# Patient Record
Sex: Male | Born: 1965 | Race: White | Hispanic: No | Marital: Married | Smoking: Never smoker
Health system: Southern US, Community
[De-identification: ages and names within clinical notes are randomized; demographics above are authoritative.]

## PROBLEM LIST (undated history)

## (undated) DIAGNOSIS — R809 Proteinuria, unspecified: Secondary | ICD-10-CM

## (undated) DIAGNOSIS — I10 Essential (primary) hypertension: Secondary | ICD-10-CM

## (undated) DIAGNOSIS — E119 Type 2 diabetes mellitus without complications: Secondary | ICD-10-CM

## (undated) DIAGNOSIS — F419 Anxiety disorder, unspecified: Secondary | ICD-10-CM

## (undated) DIAGNOSIS — G930 Cerebral cysts: Secondary | ICD-10-CM

## (undated) DIAGNOSIS — G43909 Migraine, unspecified, not intractable, without status migrainosus: Secondary | ICD-10-CM

## (undated) DIAGNOSIS — E7800 Pure hypercholesterolemia, unspecified: Secondary | ICD-10-CM

## (undated) DIAGNOSIS — N529 Male erectile dysfunction, unspecified: Secondary | ICD-10-CM

## (undated) HISTORY — DX: Male erectile dysfunction, unspecified: N52.9

## (undated) HISTORY — DX: Migraine, unspecified, not intractable, without status migrainosus: G43.909

## (undated) HISTORY — DX: Proteinuria, unspecified: R80.9

## (undated) HISTORY — DX: Cerebral cysts: G93.0

## (undated) HISTORY — PX: OTHER SURGICAL HISTORY: SHX169

## (undated) HISTORY — PX: FUNCTIONAL ENDOSCOPIC SINUS SURGERY: SUR616

## (undated) HISTORY — DX: Anxiety disorder, unspecified: F41.9

---

## 2005-10-04 ENCOUNTER — Emergency Department: Payer: Self-pay | Admitting: Emergency Medicine

## 2006-09-06 ENCOUNTER — Ambulatory Visit: Payer: Self-pay | Admitting: Internal Medicine

## 2006-09-08 ENCOUNTER — Inpatient Hospital Stay: Payer: Self-pay | Admitting: Specialist

## 2006-09-08 ENCOUNTER — Other Ambulatory Visit: Payer: Self-pay

## 2006-09-11 ENCOUNTER — Other Ambulatory Visit: Payer: Self-pay

## 2006-09-11 ENCOUNTER — Emergency Department: Payer: Self-pay | Admitting: Unknown Physician Specialty

## 2006-09-14 ENCOUNTER — Emergency Department: Payer: Self-pay | Admitting: Emergency Medicine

## 2006-09-14 ENCOUNTER — Emergency Department (HOSPITAL_COMMUNITY): Admission: EM | Admit: 2006-09-14 | Discharge: 2006-09-15 | Payer: Self-pay | Admitting: Emergency Medicine

## 2006-10-06 ENCOUNTER — Other Ambulatory Visit: Payer: Self-pay

## 2006-10-06 ENCOUNTER — Emergency Department: Payer: Self-pay | Admitting: Emergency Medicine

## 2008-08-30 ENCOUNTER — Emergency Department (HOSPITAL_COMMUNITY): Admission: EM | Admit: 2008-08-30 | Discharge: 2008-08-30 | Payer: Self-pay | Admitting: Emergency Medicine

## 2008-09-07 ENCOUNTER — Ambulatory Visit: Payer: Self-pay | Admitting: Otolaryngology

## 2009-01-18 ENCOUNTER — Emergency Department: Payer: Self-pay | Admitting: Emergency Medicine

## 2009-04-29 ENCOUNTER — Emergency Department: Payer: Self-pay | Admitting: Emergency Medicine

## 2009-05-31 ENCOUNTER — Emergency Department (HOSPITAL_COMMUNITY): Admission: EM | Admit: 2009-05-31 | Discharge: 2009-05-31 | Payer: Self-pay | Admitting: Emergency Medicine

## 2009-06-17 ENCOUNTER — Emergency Department: Payer: Self-pay | Admitting: Internal Medicine

## 2009-10-10 ENCOUNTER — Emergency Department: Payer: Self-pay

## 2010-11-14 LAB — GLUCOSE, CAPILLARY: Glucose-Capillary: 249 mg/dL — ABNORMAL HIGH (ref 70–99)

## 2010-11-14 LAB — POCT I-STAT, CHEM 8
Hemoglobin: 17 g/dL (ref 13.0–17.0)
Sodium: 136 mEq/L (ref 135–145)
TCO2: 22 mmol/L (ref 0–100)

## 2011-06-04 ENCOUNTER — Emergency Department: Payer: Self-pay | Admitting: Emergency Medicine

## 2012-03-14 ENCOUNTER — Emergency Department: Payer: Self-pay | Admitting: Emergency Medicine

## 2012-07-14 ENCOUNTER — Emergency Department: Payer: Self-pay | Admitting: Emergency Medicine

## 2012-07-14 LAB — CK TOTAL AND CKMB (NOT AT ARMC)
CK, Total: 73 U/L (ref 35–232)
CK-MB: 2.2 ng/mL (ref 0.5–3.6)

## 2012-07-14 LAB — COMPREHENSIVE METABOLIC PANEL
Albumin: 4 g/dL (ref 3.4–5.0)
Alkaline Phosphatase: 83 U/L (ref 50–136)
Bilirubin,Total: 0.4 mg/dL (ref 0.2–1.0)
Glucose: 245 mg/dL — ABNORMAL HIGH (ref 65–99)
Osmolality: 279 (ref 275–301)
SGPT (ALT): 31 U/L (ref 12–78)

## 2012-07-14 LAB — PROTIME-INR: INR: 0.8

## 2012-07-14 LAB — CBC
MCH: 28.8 pg (ref 26.0–34.0)
MCHC: 34.5 g/dL (ref 32.0–36.0)
MCV: 84 fL (ref 80–100)
Platelet: 200 10*3/uL (ref 150–440)
RDW: 13.6 % (ref 11.5–14.5)

## 2012-07-14 LAB — APTT: Activated PTT: 27.4 secs (ref 23.6–35.9)

## 2012-07-14 LAB — TROPONIN I: Troponin-I: <0.02 ng/mL

## 2013-01-03 ENCOUNTER — Emergency Department: Payer: Self-pay | Admitting: Emergency Medicine

## 2013-01-03 LAB — BASIC METABOLIC PANEL
Calcium, Total: 8.9 mg/dL (ref 8.5–10.1)
Chloride: 103 mmol/L (ref 98–107)
Co2: 27 mmol/L (ref 21–32)
Creatinine: 1.03 mg/dL (ref 0.60–1.30)
Sodium: 136 mmol/L (ref 136–145)

## 2013-01-03 LAB — CK TOTAL AND CKMB (NOT AT ARMC): CK-MB: 1.7 ng/mL (ref 0.5–3.6)

## 2013-01-03 LAB — CBC
Platelet: 188 10*3/uL (ref 150–440)
RDW: 13.8 % (ref 11.5–14.5)
WBC: 6.3 10*3/uL (ref 3.8–10.6)

## 2013-01-03 LAB — TROPONIN I: Troponin-I: <0.02 ng/mL

## 2013-09-01 ENCOUNTER — Emergency Department: Payer: Self-pay | Admitting: Emergency Medicine

## 2013-11-09 ENCOUNTER — Emergency Department: Payer: Self-pay | Admitting: Emergency Medicine

## 2014-01-16 ENCOUNTER — Emergency Department: Payer: Self-pay | Admitting: Emergency Medicine

## 2014-01-16 LAB — CBC
HCT: 46 % (ref 40.0–52.0)
HGB: 15.4 g/dL (ref 13.0–18.0)
MCH: 28.6 pg (ref 26.0–34.0)
MCHC: 33.4 g/dL (ref 32.0–36.0)
MCV: 86 fL (ref 80–100)
PLATELETS: 215 10*3/uL (ref 150–440)
RBC: 5.38 10*6/uL (ref 4.40–5.90)
RDW: 13.7 % (ref 11.5–14.5)
WBC: 8.4 10*3/uL (ref 3.8–10.6)

## 2014-01-16 LAB — BASIC METABOLIC PANEL
ANION GAP: 5 — AB (ref 7–16)
BUN: 16 mg/dL (ref 7–18)
CALCIUM: 9 mg/dL (ref 8.5–10.1)
Chloride: 102 mmol/L (ref 98–107)
Co2: 28 mmol/L (ref 21–32)
Creatinine: 1.02 mg/dL (ref 0.60–1.30)
EGFR (Non-African Amer.): >60
GLUCOSE: 254 mg/dL — AB (ref 65–99)
Osmolality: 280 (ref 275–301)
POTASSIUM: 3.9 mmol/L (ref 3.5–5.1)
Sodium: 135 mmol/L — ABNORMAL LOW (ref 136–145)

## 2014-01-16 LAB — TROPONIN I: Troponin-I: <0.02 ng/mL

## 2014-10-04 ENCOUNTER — Emergency Department: Payer: Self-pay | Admitting: Emergency Medicine

## 2015-02-26 ENCOUNTER — Encounter: Payer: Self-pay | Admitting: Emergency Medicine

## 2015-02-26 ENCOUNTER — Emergency Department
Admission: EM | Admit: 2015-02-26 | Discharge: 2015-02-26 | Disposition: A | Discharge status: Home / Self Care | Payer: Medicare Other | Attending: Emergency Medicine | Admitting: Emergency Medicine

## 2015-02-26 DIAGNOSIS — B084 Enteroviral vesicular stomatitis with exanthem: Secondary | ICD-10-CM | POA: Diagnosis not present

## 2015-02-26 DIAGNOSIS — I10 Essential (primary) hypertension: Secondary | ICD-10-CM | POA: Insufficient documentation

## 2015-02-26 DIAGNOSIS — Z79899 Other long term (current) drug therapy: Secondary | ICD-10-CM | POA: Diagnosis not present

## 2015-02-26 DIAGNOSIS — Z880 Allergy status to penicillin: Secondary | ICD-10-CM | POA: Diagnosis not present

## 2015-02-26 DIAGNOSIS — E119 Type 2 diabetes mellitus without complications: Secondary | ICD-10-CM | POA: Diagnosis not present

## 2015-02-26 DIAGNOSIS — R21 Rash and other nonspecific skin eruption: Secondary | ICD-10-CM | POA: Diagnosis present

## 2015-02-26 HISTORY — DX: Essential (primary) hypertension: I10

## 2015-02-26 HISTORY — DX: Pure hypercholesterolemia, unspecified: E78.00

## 2015-02-26 HISTORY — DX: Type 2 diabetes mellitus without complications: E11.9

## 2015-02-26 MED ORDER — ACETAMINOPHEN-CODEINE #3 300-30 MG PO TABS
1.0000 | ORAL_TABLET | Freq: Four times a day (QID) | ORAL | Status: AC | PRN
Start: 2015-02-26 — End: ?

## 2015-02-26 MED ORDER — MAGIC MOUTHWASH W/LIDOCAINE: 10.0000 mL | Freq: Three times a day (TID) | ORAL | Status: AC | PRN

## 2015-02-26 MED ORDER — ACETAMINOPHEN-CODEINE #3 300-30 MG PO TABS
2.0000 | ORAL_TABLET | Freq: Once | ORAL | Status: AC
  Administered 2015-02-26: 2 via ORAL
  Filled 2015-02-26: qty 2

## 2015-02-26 MED ORDER — ONDANSETRON 4 MG PO TBDP
4.0000 mg | ORAL_TABLET | Freq: Once | ORAL | Status: AC
  Administered 2015-02-26: 4 mg via ORAL
  Filled 2015-02-26: qty 1

## 2015-02-26 NOTE — ED Notes (Signed)
Pt reports was hanging out with his granddaughter who had hand, foot and mouth and now he has blisters on his feet, hands and mouth. Pt wife reports she had it as well but she has started to clear and hers was not as bad.

## 2015-02-26 NOTE — Discharge Instructions (Signed)
Hand, Foot, and Mouth Disease Hand, foot, and mouth disease is a common viral illness. It occurs mainly in children younger than 51 years of age, but adolescents and adults may also get it. This disease is different than foot and mouth disease that cattle, sheep, and pigs get. Most people are better in 1 week. CAUSES  Hand, foot, and mouth disease is usually caused by a group of viruses called enteroviruses. Hand, foot, and mouth disease can spread from person to person (contagious). A person is most contagious during the first week of the illness. It is not transmitted to or from pets or other animals. It is most common in the summer and early fall. Infection is spread from person to person by direct contact with an infected person's:  Nose discharge.  Throat discharge.  Stool. SYMPTOMS  Open sores (ulcers) occur in the mouth. Symptoms may also include:  A rash on the hands and feet, and occasionally the buttocks.  Fever.  Aches.  Pain from the mouth ulcers.  Fussiness. DIAGNOSIS  Hand, foot, and mouth disease is one of many infections that cause mouth sores. To be certain your child has hand, foot, and mouth disease your caregiver will diagnose your child by physical exam.Additional tests are not usually needed. TREATMENT  Nearly all patients recover without medical treatment in 7 to 10 days. There are no common complications. Your child should only take over-the-counter or prescription medicines for pain, discomfort, or fever as directed by your caregiver. Your caregiver may recommend the use of an over-the-counter antacid or a combination of an antacid and diphenhydramine to help coat the lesions in the mouth and improve symptoms.  HOME CARE INSTRUCTIONS  Try combinations of foods to see what your child will tolerate and aim for a balanced diet. Soft foods may be easier to swallow. The mouth sores from hand, foot, and mouth disease typically hurt and are painful when exposed to  salty, spicy, or acidic food or drinks.  Milk and cold drinks are soothing for some patients. Milk shakes, frozen ice pops, slushies, and sherberts are usually well tolerated.  Sport drinks are good choices for hydration, and they also provide a few calories. Often, a child with hand, foot, and mouth disease will be able to drink without discomfort.   For younger children and infants, feeding with a cup, spoon, or syringe may be less painful than drinking through the nipple of a bottle.  Keep children out of childcare programs, schools, or other group settings during the first few days of the illness or until they are without fever. The sores on the body are not contagious. SEEK IMMEDIATE MEDICAL CARE IF:  Your child develops signs of dehydration such as:  Decreased urination.  Dry mouth, tongue, or lips.  Decreased tears or sunken eyes.  Dry skin.  Rapid breathing.  Fussy behavior.  Poor color or pale skin.  Fingertips taking longer than 2 seconds to turn pink after a gentle squeeze.  Rapid weight loss.  Your child does not have adequate pain relief.  Your child develops a severe headache, stiff neck, or change in behavior.  Your child develops ulcers or blisters that occur on the lips or outside of the mouth. Document Released: 04/15/2003 Document Revised: 10/09/2011 Document Reviewed: 12/29/2010 [facility] Patient Information 2015 [place], [place]. This information is not intended to replace advice given to you by your health care provider. Make sure you discuss any questions you have with your health care provider.  Take the  meds as directed.  Continue ibuprofen for fevers.  Increase fluid intake to reduce dehydration risk.  Follow-up with Dr. Burnett Sheng as needed.

## 2015-02-26 NOTE — ED Notes (Signed)
Pt says he thinks he has hand/foot mouth disease.  grandchhild and wife just had it.  He is concerned because his foot in inflamed andhe is diabetic.  Has rash all over.  Blisters on hands.  Throat sore.

## 2015-02-26 NOTE — ED Provider Notes (Signed)
[facility] Emergency Department Provider Note ____________________________________________  Time seen: 0930  I have reviewed the triage vital signs and the nursing notes.  HISTORY  Chief Complaint  Rash  HPI Joel Norman is a 49 y.o. male reports to the ED for evaluation management of presumed hand-foot-and-mouth disease. He has been exposed to the virus via his grandkids and his wife. He is here today noting intermittent fevers, blisters on his hands and feet, sore throat and loss of appetite. He is concerned over the red blisters on his feet, given his diabetic status. He denies drainage, weeping, oozing, crusting.   Past Medical History  Diagnosis Date  . Hypertension   . Diabetes mellitus without complication   . Elevated cholesterol    There are no active problems to display for this patient.  Past Surgical History  Procedure Laterality Date  . 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 atenolol (TENORMIN) 25 MG tablet   Oral   Take by mouth daily.         . furosemide (LASIX) 40 MG tablet   Oral   Take 40 mg by mouth.         Marland Kitchen glipiZIDE (GLUCOTROL) 10 MG tablet   Oral   Take 10 mg by mouth daily before breakfast.         . lisinopril (PRINIVIL,ZESTRIL) 10 MG tablet   Oral   Take 10 mg by mouth 2 (two) times daily.         . metFORMIN (GLUCOPHAGE) 1000 MG tablet   Oral   Take 1,000 mg by mouth 2 (two) times daily with a meal.         . sertraline (ZOLOFT) 100 MG tablet   Oral   Take 100 mg by mouth daily.         Marland Kitchen acetaminophen-codeine (TYLENOL #3) 300-30 MG per tablet   Oral   Take 1 tablet by mouth every 6 (six) hours as needed for moderate pain.   12 tablet   0   . Alum & Mag Hydroxide-Simeth (MAGIC MOUTHWASH W/LIDOCAINE) SOLN   Oral   Take 10 mLs by mouth 3 (three) times daily as needed for mouth pain.   120 mL   0     Use Duke's Mouthwash recipe with  lidocaine    Allergies Penicillins  No family history on file.  Social History History  Substance Use Topics  . Smoking status: Never Smoker   . Smokeless tobacco: Not on file  . Alcohol Use: No   Review of Systems  Constitutional: Positive for fever, fatigue Eyes: Negative for visual changes. ENT: Negative for sore throat. Cardiovascular: Negative for chest pain. Respiratory: Negative for shortness of breath. Gastrointestinal: Negative for abdominal pain, vomiting and diarrhea. Anorexia  Genitourinary: Negative for dysuria. Musculoskeletal: Negative for back pain. Skin: Positive for rash. Neurological: Negative for headaches, focal weakness or numbness. ____________________________________________  PHYSICAL EXAM:  VITAL SIGNS: ED Triage Vitals  Enc Vitals Group     BP 02/26/15 0743 128/103 mmHg     Pulse Rate 02/26/15 0743 99     Resp 02/26/15 0743 20     Temp 02/26/15 0743 98.7 F (37.1 C)     Temp Source 02/26/15 0743 Oral     SpO2 02/26/15 0743 98 %     Weight 02/26/15 0743 256 lb (116.121 kg)     Height  02/26/15 0743 6' (1.829 m)     Head Cir --      Peak Flow --      Pain Score 02/26/15 0744 10     Pain Loc --      Pain Edu? --      Excl. in GC? --    Constitutional: Alert and oriented. Well appearing and in no distress. Eyes: Conjunctivae are normal. PERRL. Normal extraocular movements. ENT   Head: Normocephalic and atraumatic.   Nose: No congestion/rhinnorhea.   Mouth/Throat: Mucous membranes are moist. Shallow blisters noted on the lips, tongue, and oropharynx.   Neck: Supple. No thyromegaly. Hematological/Lymphatic/Immunilogical: No cervical lymphadenopathy. Cardiovascular: Normal rate, regular rhythm.  Respiratory: Normal respiratory effort. No wheezes/rales/rhonchi. Gastrointestinal: Soft and nontender. No distention. Musculoskeletal: Nontender with normal range of motion in all extremities.  Neurologic:  Normal gait without  ataxia. Normal speech and language. No gross focal neurologic deficits are appreciated. Skin:  Skin is warm, dry and intact. Papular rash noted to the face, torso, palms, and extremities. Feet with papular rash with local erythema. No signs of secondary infection, drainage, or weeping.  Psychiatric: Mood and affect are normal. Patient exhibits appropriate insight and judgment. ____________________________________________  PROCEDURES  Tylenol #3 PO Zofran 4 mg ODT ____________________________________________  INITIAL IMPRESSION / ASSESSMENT AND PLAN / ED COURSE  Reassurance to patient and wife about supportive care for HFMD.  Suggest pain management with Tylenol #3 for fevers and Magic Mouthwash.  Increase fluid intake to prevent dehydration. Follow-up with Dr. Burnett Sheng as needed.   ____________________________________________  FINAL CLINICAL IMPRESSION(S) / ED DIAGNOSES  Final diagnoses:  Hand, foot and mouth disease     Lissa Hoard, PA-C 02/26/15 1545  Darien Ramus, MD 02/26/15 [phone]

## 2015-03-03 ENCOUNTER — Emergency Department
Admission: EM | Admit: 2015-03-03 | Discharge: 2015-03-03 | Disposition: A | Discharge status: Home / Self Care | Payer: Medicare Other | Attending: Emergency Medicine | Admitting: Emergency Medicine

## 2015-03-03 ENCOUNTER — Encounter: Payer: Self-pay | Admitting: Medical Oncology

## 2015-03-03 DIAGNOSIS — Z880 Allergy status to penicillin: Secondary | ICD-10-CM | POA: Diagnosis not present

## 2015-03-03 DIAGNOSIS — E1165 Type 2 diabetes mellitus with hyperglycemia: Secondary | ICD-10-CM | POA: Diagnosis not present

## 2015-03-03 DIAGNOSIS — I10 Essential (primary) hypertension: Secondary | ICD-10-CM | POA: Insufficient documentation

## 2015-03-03 DIAGNOSIS — Z79899 Other long term (current) drug therapy: Secondary | ICD-10-CM | POA: Insufficient documentation

## 2015-03-03 DIAGNOSIS — R739 Hyperglycemia, unspecified: Secondary | ICD-10-CM

## 2015-03-03 DIAGNOSIS — B084 Enteroviral vesicular stomatitis with exanthem: Secondary | ICD-10-CM | POA: Diagnosis not present

## 2015-03-03 DIAGNOSIS — Z4800 Encounter for change or removal of nonsurgical wound dressing: Secondary | ICD-10-CM | POA: Diagnosis present

## 2015-03-03 LAB — BASIC METABOLIC PANEL
Anion gap: 9 (ref 5–15)
BUN: 16 mg/dL (ref 6–20)
CALCIUM: 8.7 mg/dL — AB (ref 8.9–10.3)
CHLORIDE: 99 mmol/L — AB (ref 101–111)
CO2: 23 mmol/L (ref 22–32)
Creatinine, Ser: 0.83 mg/dL (ref 0.61–1.24)
GFR calc Af Amer: >60 mL/min (ref >60)
GFR calc non Af Amer: >60 mL/min (ref >60)
Glucose, Bld: 290 mg/dL — ABNORMAL HIGH (ref 65–99)
POTASSIUM: 4 mmol/L (ref 3.5–5.1)
Sodium: 131 mmol/L — ABNORMAL LOW (ref 135–145)

## 2015-03-03 LAB — CBC WITH DIFFERENTIAL/PLATELET
BASOS ABS: 0 10*3/uL (ref 0–0.1)
BASOS PCT: 0 %
Eosinophils Absolute: 0.7 10*3/uL (ref 0–0.7)
Eosinophils Relative: 11 %
HCT: 41.1 % (ref 40.0–52.0)
HEMOGLOBIN: 14.3 g/dL (ref 13.0–18.0)
LYMPHS PCT: 33 %
Lymphs Abs: 2.3 10*3/uL (ref 1.0–3.6)
MCH: 28.3 pg (ref 26.0–34.0)
MCHC: 34.7 g/dL (ref 32.0–36.0)
MCV: 81.6 fL (ref 80.0–100.0)
Monocytes Absolute: 0.5 10*3/uL (ref 0.2–1.0)
Monocytes Relative: 7 %
Neutro Abs: 3.5 10*3/uL (ref 1.4–6.5)
Neutrophils Relative %: 49 %
Platelets: 206 10*3/uL (ref 150–440)
RBC: 5.04 MIL/uL (ref 4.40–5.90)
RDW: 13.7 % (ref 11.5–14.5)
WBC: 7 10*3/uL (ref 3.8–10.6)

## 2015-03-03 LAB — GLUCOSE, CAPILLARY: Glucose-Capillary: 304 mg/dL — ABNORMAL HIGH (ref 65–99)

## 2015-03-03 MED ORDER — SODIUM CHLORIDE 0.9 % IV BOLUS (SEPSIS)
1000.0000 mL | Freq: Once | INTRAVENOUS | Status: AC
  Administered 2015-03-03: 1000 mL via INTRAVENOUS

## 2015-03-03 MED ORDER — OXYCODONE-ACETAMINOPHEN 5-325 MG PO TABS
1.0000 | ORAL_TABLET | ORAL | Status: DC | PRN
Start: 2015-03-03 — End: 2021-10-21

## 2015-03-03 MED ORDER — OXYCODONE-ACETAMINOPHEN 5-325 MG PO TABS
1.0000 | ORAL_TABLET | ORAL | Status: AC
  Administered 2015-03-03: 1 via ORAL
  Filled 2015-03-03: qty 1

## 2015-03-03 NOTE — ED Provider Notes (Signed)
[facility] Emergency Department Provider Note   ____________________________________________  Time seen: 72  I have reviewed the triage vital signs and the nursing notes.   HISTORY  Chief Complaint Wound Check   History limited by: Not Limited   HPI Joel Norman is a 49 y.o. male who presents to the emergency department today because of continued rash. Patient states that roughly 10 days ago he started developing a rash in his hands and feet. He was seen in the emergency department and diagnosed with hand-foot-and-mouth disease. He states that since that time the rash has continued and is formed blisters. States it is worse in his hands and feet. It does also about his face and scalp in the ears. He has had associated fatigue. He denies any fevers. Denies any nausea or vomiting.   Past Medical History  Diagnosis Date  . Hypertension   . Diabetes mellitus without complication   . Elevated cholesterol     There are no active problems to display for this patient.   Past Surgical History  Procedure Laterality Date  . 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 acetaminophen-codeine (TYLENOL #3) 300-30 MG per tablet   Oral   Take 1 tablet by mouth every 6 (six) hours as needed for moderate pain.   12 tablet   0   . Alum & Mag Hydroxide-Simeth (MAGIC MOUTHWASH W/LIDOCAINE) SOLN   Oral   Take 10 mLs by mouth 3 (three) times daily as needed for mouth pain.   120 mL   0     Use Duke's Mouthwash recipe with lidocaine   . atenolol (TENORMIN) 25 MG tablet   Oral   Take by mouth daily.         . furosemide (LASIX) 40 MG tablet   Oral   Take 40 mg by mouth.         Marland Kitchen glipiZIDE (GLUCOTROL) 10 MG tablet   Oral   Take 10 mg by mouth daily before breakfast.         . lisinopril (PRINIVIL,ZESTRIL) 10 MG tablet   Oral   Take 10 mg by mouth 2 (two) times daily.         . metFORMIN  (GLUCOPHAGE) 1000 MG tablet   Oral   Take 1,000 mg by mouth 2 (two) times daily with a meal.         . sertraline (ZOLOFT) 100 MG tablet   Oral   Take 100 mg by mouth daily.           Allergies Penicillins  No family history on file.  Social History History  Substance Use Topics  . Smoking status: Never Smoker   . Smokeless tobacco: Not on file  . Alcohol Use: No    Review of Systems  Constitutional: Negative for fever. Cardiovascular: Negative for chest pain. Respiratory: Negative for shortness of breath. Gastrointestinal: Negative for abdominal pain, vomiting and diarrhea. Genitourinary: Negative for dysuria. Musculoskeletal: Negative for back pain. Skin: Positive for rash for rash. Neurological: Negative for headaches, focal weakness or numbness.   10-point ROS otherwise negative.  ____________________________________________   PHYSICAL EXAM:  VITAL SIGNS: ED Triage Vitals  Enc Vitals Group     BP 03/03/15 1744 150/94 mmHg     Pulse Rate 03/03/15 1744 96     Resp 03/03/15 1744 18     Temp 03/03/15 1744 98.2  F (36.8 C)     Temp Source 03/03/15 1744 Oral     SpO2 03/03/15 1744 98 %     Weight 03/03/15 1744 256 lb (116.121 kg)     Height 03/03/15 1744 6' (1.829 m)     Head Cir --      Peak Flow --      Pain Score 03/03/15 1744 8   Constitutional: Alert and oriented. Well appearing and in no distress. Eyes: Conjunctivae are normal. PERRL. Normal extraocular movements. ENT   Head: Normocephalic and atraumatic.   Nose: No congestion/rhinnorhea.   Mouth/Throat: Mucous membranes are moist.   Neck: No stridor. Hematological/Lymphatic/Immunilogical: No cervical lymphadenopathy. Cardiovascular: Normal rate, regular rhythm.  No murmurs, rubs, or gallops. Respiratory: Normal respiratory effort without tachypnea nor retractions. Breath sounds are clear and equal bilaterally. No wheezes/rales/rhonchi. Gastrointestinal: Soft and nontender. No  distention.  Genitourinary: Deferred Musculoskeletal: Normal range of motion in all extremities. No joint effusions.  No lower extremity tenderness nor edema. Neurologic:  Normal speech and language. No gross focal neurologic deficits are appreciated. Speech is normal.  Skin:  Patient with rash over his upper and lower extremities primarily over the feet and hands. Per his some blistering. Discrete lesions. No surrounding erythema, pus or pustules concerning for secondary infection. Patient does have some peeling of the skin of the foot. In addition he has rash about his years and some on his scalp and nape. No inner oral lesions appreciated.  Psychiatric: Mood and affect are normal. Speech and behavior are normal. Patient exhibits appropriate insight and judgment.  ____________________________________________    LABS (pertinent positives/negatives)  Labs Reviewed  GLUCOSE, CAPILLARY - Abnormal; Notable for the following:    Glucose-Capillary 304 (*)    All other components within normal limits  BASIC METABOLIC PANEL - Abnormal; Notable for the following:    Sodium 131 (*)    Chloride 99 (*)    Glucose, Bld 290 (*)    Calcium 8.7 (*)    All other components within normal limits  CBC WITH DIFFERENTIAL/PLATELET     ____________________________________________   EKG  None  ____________________________________________    RADIOLOGY  None  ____________________________________________   PROCEDURES  Procedure(s) performed: None  Critical Care performed: No  ____________________________________________   INITIAL IMPRESSION / ASSESSMENT AND PLAN / ED COURSE  Pertinent labs & imaging results that were available during my care of the patient were reviewed by me and considered in my medical decision making (see chart for details).  Patient presented to the emergency department today with concerns for continued rash from hand-foot-and-mouth disease. Exam patient does have  multiple lesions on both the distal upper and lower extremities. No oral involvement noted today. No signs of any secondary infection. Blood work does show elevated sugars however no signs of DKA. He likely patient suffering from continued hand-foot-and-mouth. Instructed patient to follow-up with primary care physician. Will give pain medication.  ____________________________________________   FINAL CLINICAL IMPRESSION(S) / ED DIAGNOSES  Final diagnoses:  Hand, foot and mouth disease  Hyperglycemia     Phineas Semen, MD 03/03/15 2112

## 2015-03-03 NOTE — ED Notes (Signed)

## 2015-03-03 NOTE — Discharge Instructions (Signed)
Please seek medical attention for any high fevers, chest pain, shortness of breath, change in behavior, persistent vomiting, bloody stool or any other new or concerning symptoms.  Hand, Foot, and Mouth Disease Hand, foot, and mouth disease is a common viral illness. It occurs mainly in children younger than 51 years of age, but adolescents and adults may also get it. This disease is different than foot and mouth disease that cattle, sheep, and pigs get. Most people are better in 1 week. CAUSES  Hand, foot, and mouth disease is usually caused by a group of viruses called enteroviruses. Hand, foot, and mouth disease can spread from person to person (contagious). A person is most contagious during the first week of the illness. It is not transmitted to or from pets or other animals. It is most common in the summer and early fall. Infection is spread from person to person by direct contact with an infected person's:  Nose discharge.  Throat discharge.  Stool. SYMPTOMS  Open sores (ulcers) occur in the mouth. Symptoms may also include:  A rash on the hands and feet, and occasionally the buttocks.  Fever.  Aches.  Pain from the mouth ulcers.  Fussiness. DIAGNOSIS  Hand, foot, and mouth disease is one of many infections that cause mouth sores. To be certain your child has hand, foot, and mouth disease your caregiver will diagnose your child by physical exam.Additional tests are not usually needed. TREATMENT  Nearly all patients recover without medical treatment in 7 to 10 days. There are no common complications. Your child should only take over-the-counter or prescription medicines for pain, discomfort, or fever as directed by your caregiver. Your caregiver may recommend the use of an over-the-counter antacid or a combination of an antacid and diphenhydramine to help coat the lesions in the mouth and improve symptoms.  HOME CARE INSTRUCTIONS  Try combinations of foods to see what your child  will tolerate and aim for a balanced diet. Soft foods may be easier to swallow. The mouth sores from hand, foot, and mouth disease typically hurt and are painful when exposed to salty, spicy, or acidic food or drinks.  Milk and cold drinks are soothing for some patients. Milk shakes, frozen ice pops, slushies, and sherberts are usually well tolerated.  Sport drinks are good choices for hydration, and they also provide a few calories. Often, a child with hand, foot, and mouth disease will be able to drink without discomfort.   For younger children and infants, feeding with a cup, spoon, or syringe may be less painful than drinking through the nipple of a bottle.  Keep children out of childcare programs, schools, or other group settings during the first few days of the illness or until they are without fever. The sores on the body are not contagious. SEEK IMMEDIATE MEDICAL CARE IF:  Your child develops signs of dehydration such as:  Decreased urination.  Dry mouth, tongue, or lips.  Decreased tears or sunken eyes.  Dry skin.  Rapid breathing.  Fussy behavior.  Poor color or pale skin.  Fingertips taking longer than 2 seconds to turn pink after a gentle squeeze.  Rapid weight loss.  Your child does not have adequate pain relief.  Your child develops a severe headache, stiff neck, or change in behavior.  Your child develops ulcers or blisters that occur on the lips or outside of the mouth. Document Released: 04/15/2003 Document Revised: 10/09/2011 Document Reviewed: 12/29/2010 [facility] Patient Information 2015 [place], [place]. This information is not  intended to replace advice given to you by your health care provider. Make sure you discuss any questions you have with your health care provider. ° °

## 2015-03-03 NOTE — ED Notes (Signed)
Pt recently diagnosed with hand foot and mouth disease. Pt reports rash has continued to worsen and feet are painful. Pt concerned bc he is diabetic.

## 2016-06-07 ENCOUNTER — Encounter: Payer: Self-pay | Admitting: Emergency Medicine

## 2016-06-07 ENCOUNTER — Emergency Department
Admission: EM | Admit: 2016-06-07 | Discharge: 2016-06-07 | Disposition: A | Discharge status: Home / Self Care | Payer: Medicare Other | Attending: Emergency Medicine | Admitting: Emergency Medicine

## 2016-06-07 DIAGNOSIS — Z791 Long term (current) use of non-steroidal anti-inflammatories (NSAID): Secondary | ICD-10-CM | POA: Insufficient documentation

## 2016-06-07 DIAGNOSIS — W228XXA Striking against or struck by other objects, initial encounter: Secondary | ICD-10-CM | POA: Insufficient documentation

## 2016-06-07 DIAGNOSIS — I10 Essential (primary) hypertension: Secondary | ICD-10-CM | POA: Insufficient documentation

## 2016-06-07 DIAGNOSIS — Y929 Unspecified place or not applicable: Secondary | ICD-10-CM | POA: Diagnosis not present

## 2016-06-07 DIAGNOSIS — Y990 Civilian activity done for income or pay: Secondary | ICD-10-CM | POA: Insufficient documentation

## 2016-06-07 DIAGNOSIS — Z79899 Other long term (current) drug therapy: Secondary | ICD-10-CM | POA: Insufficient documentation

## 2016-06-07 DIAGNOSIS — Y9389 Activity, other specified: Secondary | ICD-10-CM | POA: Diagnosis not present

## 2016-06-07 DIAGNOSIS — Z7984 Long term (current) use of oral hypoglycemic drugs: Secondary | ICD-10-CM | POA: Diagnosis not present

## 2016-06-07 DIAGNOSIS — S6991XA Unspecified injury of right wrist, hand and finger(s), initial encounter: Secondary | ICD-10-CM | POA: Diagnosis present

## 2016-06-07 DIAGNOSIS — L089 Local infection of the skin and subcutaneous tissue, unspecified: Secondary | ICD-10-CM | POA: Diagnosis not present

## 2016-06-07 DIAGNOSIS — E119 Type 2 diabetes mellitus without complications: Secondary | ICD-10-CM | POA: Diagnosis not present

## 2016-06-07 DIAGNOSIS — T148XXA Other injury of unspecified body region, initial encounter: Secondary | ICD-10-CM

## 2016-06-07 MED ORDER — SULFAMETHOXAZOLE-TRIMETHOPRIM 800-160 MG PO TABS: 1.0000 | ORAL_TABLET | Freq: Two times a day (BID) | ORAL | 0 refills | Status: AC

## 2016-06-07 MED ORDER — NAPROXEN 500 MG PO TABS: 500.0000 mg | ORAL_TABLET | Freq: Two times a day (BID) | ORAL | 0 refills | Status: AC

## 2016-06-07 MED ORDER — NAPROXEN 500 MG PO TABS
ORAL_TABLET | ORAL | Status: AC
  Administered 2016-06-07: 500 mg via ORAL
  Filled 2016-06-07: qty 1

## 2016-06-07 MED ORDER — ONDANSETRON 4 MG PO TBDP: 4.0000 mg | ORAL_TABLET | Freq: Three times a day (TID) | ORAL | 0 refills | Status: DC | PRN

## 2016-06-07 MED ORDER — CEPHALEXIN 500 MG PO CAPS: 500.0000 mg | ORAL_CAPSULE | Freq: Three times a day (TID) | ORAL | 0 refills | Status: AC

## 2016-06-07 MED ORDER — NAPROXEN 500 MG PO TABS
500.0000 mg | ORAL_TABLET | Freq: Once | ORAL | Status: AC
  Administered 2016-06-07: 500 mg via ORAL

## 2016-06-07 NOTE — ED Triage Notes (Addendum)
Pt presents with blister to base of right index finger. Also reports previous blister that has turned into a sore on the same hand. Redness and swelling noted and pt with hx of DM and bacterial infections (staph),  with previous blisters and was told to have them checked if happens again.

## 2016-06-07 NOTE — ED Provider Notes (Signed)
[facility] Emergency Department Provider Note  ____________________________________________  Time seen: Approximately 8:02 PM  I have reviewed the triage vital signs and the nursing notes.   HISTORY  Chief Complaint Blister and Hand Pain    HPI Joel Norman is a 50 y.o. male who complains of a blister to the right index finger on the radial aspect. 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 Denies any fevers or chills. He is concerned about a infection of the finger especially with his diabetes. No new medications. No fever chills nausea vomiting.     Past Medical History:  Diagnosis Date  . Diabetes mellitus without complication (HCC)   . Elevated cholesterol   . Hypertension      There are no active problems to display for this patient.    Past Surgical History:  Procedure Laterality Date  . 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 Authorizing Provider  acetaminophen-codeine (TYLENOL #3) 300-30 MG per tablet Take 1 tablet by mouth every 6 (six) hours as needed for moderate pain. 02/26/15   Jenise V Bacon Menshew, PA-C  Alum & Mag Hydroxide-Simeth (MAGIC MOUTHWASH W/LIDOCAINE) SOLN Take 10 mLs by mouth 3 (three) times daily as needed for mouth pain. 02/26/15   Jenise V Bacon Menshew, PA-C  atenolol (TENORMIN) 25 MG tablet Take by mouth daily.    Historical Provider, MD  cephALEXin (KEFLEX) 500 MG capsule Take 1 capsule (500 mg total) by mouth 3 (three) times daily. 06/07/16   Sharman CheekPhillip Emilyanne Mcgough, MD  furosemide (LASIX) 40 MG tablet Take 40 mg by mouth.    Historical Provider, MD  glipiZIDE (GLUCOTROL) 10 MG tablet Take 10 mg by mouth daily before breakfast.    Historical Provider, MD  lisinopril (PRINIVIL,ZESTRIL) 10 MG tablet Take 10 mg by mouth 2 (two) times daily.     Historical Provider, MD  metFORMIN (GLUCOPHAGE) 1000 MG tablet Take 1,000 mg by mouth 2 (two) times daily with a meal.    Historical Provider, MD  naproxen (NAPROSYN) 500 MG tablet Take 1 tablet (500 mg total) by mouth 2 (two) times daily with a meal. 06/07/16   Sharman CheekPhillip Maryse Brierley, MD  ondansetron (ZOFRAN ODT) 4 MG disintegrating tablet Take 1 tablet (4 mg total) by mouth every 8 (eight) hours as needed for nausea or vomiting. 06/07/16   Sharman CheekPhillip Caili Escalera, MD  oxyCODONE-acetaminophen (ROXICET) 5-325 MG per tablet Take 1 tablet by mouth every 4 (four) hours as needed for severe pain. 03/03/15   Phineas SemenGraydon Goodman, MD  sertraline (ZOLOFT) 100 MG tablet Take 100 mg by mouth daily.    Historical Provider, MD  sulfamethoxazole-trimethoprim (BACTRIM DS) 800-160 MG tablet Take 1 tablet by mouth 2 (two) times daily. 06/07/16   Sharman CheekPhillip Kristina Mcnorton, MD     Allergies Penicillins   No family history on file.  Social History Social History  Substance Use Topics  . Smoking status: Never Smoker  . Smokeless tobacco: Never Used  . Alcohol use No    Review of Systems  Constitutional:   No fever or chills.  ENT:   No sore throat. No rhinorrhea. Cardiovascular:   No chest pain. Respiratory:   No dyspnea or cough.  Musculoskeletal:   Right hand pain with a blister on the index finger  10-point ROS otherwise negative.  ____________________________________________  PHYSICAL EXAM:  VITAL SIGNS: ED Triage Vitals  Enc Vitals Group     BP 06/07/16 1812 (!) 157/98     Pulse Rate 06/07/16 1812 81     Resp 06/07/16 1934 16     Temp 06/07/16 1812 98 F (36.7 C)     Temp Source 06/07/16 1812 Oral     SpO2 06/07/16 1812 99 %     Weight 06/07/16 1812 240 lb (108.9 kg)     Height 06/07/16 1812 6' (1.829 m)     Head Circumference --      Peak Flow --      Pain Score 06/07/16 1812 5     Pain Loc --      Pain Edu? --      Excl. in GC? --     Vital signs reviewed, nursing assessments  reviewed.   Constitutional:   Alert and oriented. Well appearing and in no distress. Eyes:   No scleral icterus. No conjunctival pallor. PERRL. EOMI.  No nystagmus. ENT   Head:   Normocephalic and atraumatic.   Nose:   No congestion/rhinnorhea. No septal hematoma  Musculoskeletal:  1 cm hemorrhagic blister on the right index finger over the proximal phalanx with a erythematous base. No purulent drainage. No induration. No lymphangitis. Full range of motion in all joints, normal tendon function. No crepitus. Neurologic:   Normal speech and language.  CN 2-10 normal. Motor grossly intact. No gross focal neurologic deficits are appreciated.  Skin:    Skin is warm, dry and intact except blister as noted above. No rash noted.  No petechiae, purpura, or bullae. Negative nikolski sign  ____________________________________________    LABS (pertinent positives/negatives) (all labs ordered are listed, but only abnormal results are displayed) Labs Reviewed - No data to display ____________________________________________   EKG    ____________________________________________    RADIOLOGY    ____________________________________________   PROCEDURES Procedures  ____________________________________________   INITIAL IMPRESSION / ASSESSMENT AND PLAN / ED COURSE  Pertinent labs & imaging results that were available during my care of the patient were reviewed by me and considered in my medical decision making (see chart for details).  Patient well appearing no acute distress. Presents with finger pain and a blister which appears to be hemorrhagic. There is a small amount of skin inflammation which is possibly infectious, and with his diabetes he is at higher risk. He has a history of skin infections. All started him on Keflex and Bactrim, advise them to keep the area protected, follow up with primary care. Advised him I would not lance the blister or debrided at this time and  that he should just can't continue to keep the area protected and take antibiotics. Tylenol or naproxen for pain.     Clinical Course    ____________________________________________   FINAL CLINICAL IMPRESSION(S) / ED DIAGNOSES  Final diagnoses:  Blister  Skin infection       Portions of this note were generated with dragon dictation software. Dictation errors may occur despite best attempts at proofreading.    Sharman CheekPhillip Jahaan Vanwagner, MD 06/07/16 2008

## 2016-10-11 DIAGNOSIS — E7800 Pure hypercholesterolemia, unspecified: Secondary | ICD-10-CM | POA: Insufficient documentation

## 2016-10-11 DIAGNOSIS — F419 Anxiety disorder, unspecified: Secondary | ICD-10-CM | POA: Insufficient documentation

## 2016-10-11 DIAGNOSIS — I10 Essential (primary) hypertension: Secondary | ICD-10-CM

## 2016-10-11 DIAGNOSIS — N529 Male erectile dysfunction, unspecified: Secondary | ICD-10-CM | POA: Insufficient documentation

## 2016-10-11 DIAGNOSIS — E119 Type 2 diabetes mellitus without complications: Secondary | ICD-10-CM | POA: Insufficient documentation

## 2016-10-11 DIAGNOSIS — G43909 Migraine, unspecified, not intractable, without status migrainosus: Secondary | ICD-10-CM | POA: Insufficient documentation

## 2016-10-11 DIAGNOSIS — G930 Cerebral cysts: Secondary | ICD-10-CM

## 2016-11-20 ENCOUNTER — Ambulatory Visit: Payer: Self-pay | Admitting: Unknown Physician Specialty

## 2017-05-13 ENCOUNTER — Emergency Department
Admission: EM | Admit: 2017-05-13 | Discharge: 2017-05-13 | Disposition: A | Discharge status: Home / Self Care | Payer: Medicare Other | Attending: Emergency Medicine | Admitting: Emergency Medicine

## 2017-05-13 ENCOUNTER — Encounter: Payer: Self-pay | Admitting: Emergency Medicine

## 2017-05-13 ENCOUNTER — Emergency Department: Payer: Medicare Other

## 2017-05-13 DIAGNOSIS — E119 Type 2 diabetes mellitus without complications: Secondary | ICD-10-CM | POA: Insufficient documentation

## 2017-05-13 DIAGNOSIS — Y999 Unspecified external cause status: Secondary | ICD-10-CM | POA: Diagnosis not present

## 2017-05-13 DIAGNOSIS — I10 Essential (primary) hypertension: Secondary | ICD-10-CM | POA: Diagnosis not present

## 2017-05-13 DIAGNOSIS — Z79899 Other long term (current) drug therapy: Secondary | ICD-10-CM | POA: Diagnosis not present

## 2017-05-13 DIAGNOSIS — Y939 Activity, unspecified: Secondary | ICD-10-CM | POA: Diagnosis not present

## 2017-05-13 DIAGNOSIS — S4992XA Unspecified injury of left shoulder and upper arm, initial encounter: Secondary | ICD-10-CM | POA: Diagnosis present

## 2017-05-13 DIAGNOSIS — Y929 Unspecified place or not applicable: Secondary | ICD-10-CM | POA: Diagnosis not present

## 2017-05-13 DIAGNOSIS — Z7984 Long term (current) use of oral hypoglycemic drugs: Secondary | ICD-10-CM | POA: Diagnosis not present

## 2017-05-13 DIAGNOSIS — S46912A Strain of unspecified muscle, fascia and tendon at shoulder and upper arm level, left arm, initial encounter: Secondary | ICD-10-CM | POA: Insufficient documentation

## 2017-05-13 MED ORDER — MELOXICAM 15 MG PO TABS: 15.0000 mg | ORAL_TABLET | Freq: Every day | ORAL | 2 refills | Status: AC

## 2017-05-13 MED ORDER — METHOCARBAMOL 750 MG PO TABS: 750.0000 mg | ORAL_TABLET | Freq: Four times a day (QID) | ORAL | 0 refills | Status: AC
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
## 2017-05-13 NOTE — ED Notes (Signed)
Arm sling applied to left arm and gave ice
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
## 2017-05-13 NOTE — Discharge Instructions (Signed)
His medications as prescribed. Wear the sling as needed.  Ice to the affected area at least 3 times a day. Please move around sig or muscles do not get stiff. Follow-up with Dr. Hyacinth Meeker if you are not better in 5-7 days. Please call for an appointment or use the acute care
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
## 2018-10-31 ENCOUNTER — Emergency Department
Admission: EM | Admit: 2018-10-31 | Discharge: 2018-10-31 | Disposition: A | Discharge status: Home / Self Care | Payer: Medicare Other | Attending: Emergency Medicine | Admitting: Emergency Medicine

## 2018-10-31 ENCOUNTER — Other Ambulatory Visit: Payer: Self-pay

## 2018-10-31 ENCOUNTER — Emergency Department: Payer: Medicare Other

## 2018-10-31 DIAGNOSIS — R1084 Generalized abdominal pain: Secondary | ICD-10-CM | POA: Insufficient documentation

## 2018-10-31 DIAGNOSIS — I10 Essential (primary) hypertension: Secondary | ICD-10-CM | POA: Insufficient documentation

## 2018-10-31 DIAGNOSIS — R112 Nausea with vomiting, unspecified: Secondary | ICD-10-CM | POA: Diagnosis present

## 2018-10-31 DIAGNOSIS — Z79899 Other long term (current) drug therapy: Secondary | ICD-10-CM | POA: Diagnosis not present

## 2018-10-31 DIAGNOSIS — Z7984 Long term (current) use of oral hypoglycemic drugs: Secondary | ICD-10-CM | POA: Insufficient documentation

## 2018-10-31 DIAGNOSIS — E119 Type 2 diabetes mellitus without complications: Secondary | ICD-10-CM | POA: Insufficient documentation

## 2018-10-31 DIAGNOSIS — R197 Diarrhea, unspecified: Secondary | ICD-10-CM | POA: Diagnosis not present

## 2018-10-31 LAB — URINALYSIS, COMPLETE (UACMP) WITH MICROSCOPIC
Bacteria, UA: NONE SEEN
Bilirubin Urine: NEGATIVE
Glucose, UA: >=500 mg/dL — AB
Hgb urine dipstick: NEGATIVE
Ketones, ur: NEGATIVE mg/dL
Leukocytes,Ua: NEGATIVE
Nitrite: NEGATIVE
Protein, ur: NEGATIVE mg/dL
Specific Gravity, Urine: 1.036 — ABNORMAL HIGH (ref 1.005–1.030)
Squamous Epithelial / LPF: NONE SEEN (ref 0–5)
pH: 7 (ref 5.0–8.0)

## 2018-10-31 LAB — COMPREHENSIVE METABOLIC PANEL
ALT: 26 U/L (ref 0–44)
AST: 23 U/L (ref 15–41)
Albumin: 4.3 g/dL (ref 3.5–5.0)
Alkaline Phosphatase: 80 U/L (ref 38–126)
Anion gap: 11 (ref 5–15)
BUN: 20 mg/dL (ref 6–20)
CO2: 24 mmol/L (ref 22–32)
Calcium: 9.4 mg/dL (ref 8.9–10.3)
Chloride: 105 mmol/L (ref 98–111)
Creatinine, Ser: 1.07 mg/dL (ref 0.61–1.24)
GFR calc Af Amer: >60 mL/min (ref >60)
GFR calc non Af Amer: >60 mL/min (ref >60)
Glucose, Bld: 256 mg/dL — ABNORMAL HIGH (ref 70–99)
Potassium: 3.6 mmol/L (ref 3.5–5.1)
Sodium: 140 mmol/L (ref 135–145)
Total Bilirubin: 0.6 mg/dL (ref 0.3–1.2)
Total Protein: 7.6 g/dL (ref 6.5–8.1)

## 2018-10-31 LAB — CBC WITH DIFFERENTIAL/PLATELET
Abs Immature Granulocytes: 0.04 10*3/uL (ref 0.00–0.07)
Basophils Absolute: 0.1 10*3/uL (ref 0.0–0.1)
Basophils Relative: 1 %
Eosinophils Absolute: 1 10*3/uL — ABNORMAL HIGH (ref 0.0–0.5)
Eosinophils Relative: 14 %
HCT: 45.1 % (ref 39.0–52.0)
Hemoglobin: 15 g/dL (ref 13.0–17.0)
Immature Granulocytes: 1 %
Lymphocytes Relative: 39 %
Lymphs Abs: 2.9 10*3/uL (ref 0.7–4.0)
MCH: 27.4 pg (ref 26.0–34.0)
MCHC: 33.3 g/dL (ref 30.0–36.0)
MCV: 82.4 fL (ref 80.0–100.0)
Monocytes Absolute: 0.6 10*3/uL (ref 0.1–1.0)
Monocytes Relative: 8 %
Neutro Abs: 2.7 10*3/uL (ref 1.7–7.7)
Neutrophils Relative %: 37 %
Platelets: 268 10*3/uL (ref 150–400)
RBC: 5.47 MIL/uL (ref 4.22–5.81)
RDW: 13.2 % (ref 11.5–15.5)
WBC: 7.3 10*3/uL (ref 4.0–10.5)
nRBC: 0 % (ref 0.0–0.2)

## 2018-10-31 LAB — LIPASE, BLOOD: Lipase: 55 U/L — ABNORMAL HIGH (ref 11–51)

## 2018-10-31 LAB — TROPONIN I: Troponin I: <0.03 ng/mL (ref <0.03)

## 2018-10-31 LAB — ETHANOL: Alcohol, Ethyl (B): <10 mg/dL (ref <10)

## 2018-10-31 MED ORDER — FENTANYL CITRATE (PF) 100 MCG/2ML IJ SOLN
50.0000 ug | Freq: Once | INTRAMUSCULAR | Status: AC
Start: 2018-10-31 — End: 2018-10-31
  Administered 2018-10-31: 50 ug via INTRAVENOUS
  Filled 2018-10-31: qty 2

## 2018-10-31 MED ORDER — ONDANSETRON HCL 4 MG/2ML IJ SOLN
4.0000 mg | Freq: Once | INTRAMUSCULAR | Status: AC
  Administered 2018-10-31: 01:00:00 4 mg via INTRAVENOUS
  Filled 2018-10-31: qty 2

## 2018-10-31 MED ORDER — ONDANSETRON 4 MG PO TBDP: 4.0000 mg | ORAL_TABLET | Freq: Three times a day (TID) | ORAL | 0 refills | Status: AC | PRN

## 2018-10-31 MED ORDER — IOHEXOL 240 MG/ML SOLN
25.0000 mL | INTRAMUSCULAR | Status: AC
  Administered 2018-10-31: 25 mL via ORAL

## 2018-10-31 MED ORDER — IOHEXOL 300 MG/ML  SOLN
100.0000 mL | Freq: Once | INTRAMUSCULAR | Status: AC | PRN
  Administered 2018-10-31: 100 mL via INTRAVENOUS

## 2018-10-31 MED ORDER — SODIUM CHLORIDE 0.9 % IV BOLUS
1000.0000 mL | Freq: Once | INTRAVENOUS | Status: AC
  Administered 2018-10-31: 01:00:00 1000 mL via INTRAVENOUS

## 2018-10-31 NOTE — ED Provider Notes (Signed)
[facility] Emergency Department Provider Note   ____________________________________________   First MD Initiated Contact with Patient 10/31/18 [id number]     (approximate)  I have reviewed the triage vital signs and the nursing notes.   HISTORY  Chief Complaint Abdominal Pain    HPI Joel Norman is a 53 y.o. male who presents to the ED from home with a chief complaint of abdominal pain, nausea/vomiting/diarrhea.  Patient ate [employer] and subsequently developed generalized abdominal pain associated with N/V/D.  Wife ate the same thing and she does not have symptoms.  Denies associated fever, chills, cough, congestion, chest pain, shortness of breath.  Also endorses dysuria.  Denies recent travel or trauma.  Denies exposure to persons diagnosed with coronavirus.       Past Medical History:  Diagnosis Date  . Anxiety   . Arachnoid cyst    causes headaches  . Diabetes mellitus without complication (HCC)   . ED (erectile dysfunction)   . Elevated cholesterol   . Hypertension   . Microalbuminuria   . Migraines     Patient Active Problem List   Diagnosis Date Noted  . Elevated cholesterol   . Hypertension   . Diabetes mellitus without complication (HCC)   . Anxiety   . Arachnoid cyst   . ED (erectile dysfunction)   . Migraines     Past Surgical History:  Procedure Laterality Date  . 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 FUNCTIONAL ENDOSCOPIC SINUS SURGERY      Prior to Admission medications   Medication Sig Start Date End Date Taking? Authorizing Provider  acetaminophen-codeine (TYLENOL #3) 300-30 MG per tablet Take 1 tablet by mouth every 6 (six) hours as needed for moderate pain. 02/26/15   Menshew, Charlesetta Ivory, PA-C  Alum & Mag Hydroxide-Simeth (MAGIC MOUTHWASH W/LIDOCAINE) SOLN Take 10 mLs by mouth 3 (three) times daily as needed for mouth pain. 02/26/15   Menshew, Charlesetta Ivory, PA-C  atenolol (TENORMIN) 25 MG tablet Take  by mouth daily.    [provider]  cephALEXin (KEFLEX) 500 MG capsule Take 1 capsule (500 mg total) by mouth 3 (three) times daily. 06/07/16   Sharman Cheek, MD  furosemide (LASIX) 40 MG tablet Take 40 mg by mouth.    [provider]  glipiZIDE (GLUCOTROL) 10 MG tablet Take 10 mg by mouth daily before breakfast.    [provider]  lisinopril (PRINIVIL,ZESTRIL) 10 MG tablet Take 10 mg by mouth 2 (two) times daily.    [provider]  metFORMIN (GLUCOPHAGE) 1000 MG tablet Take 1,000 mg by mouth 2 (two) times daily with a meal.    [provider]  methocarbamol (ROBAXIN) 750 MG tablet Take 1 tablet (750 mg total) by mouth 4 (four) times daily. 05/13/17   Fisher, Roselyn Bering, PA-C  naproxen (NAPROSYN) 500 MG tablet Take 1 tablet (500 mg total) by mouth 2 (two) times daily with a meal. 06/07/16   Sharman Cheek, MD  ondansetron (ZOFRAN ODT) 4 MG disintegrating tablet Take 1 tablet (4 mg total) by mouth every 8 (eight) hours as needed for nausea or vomiting. 06/07/16   Sharman Cheek, MD  oxyCODONE-acetaminophen (ROXICET) 5-325 MG per tablet Take 1 tablet by mouth every 4 (four) hours as needed for severe pain. 03/03/15   Phineas Semen, MD  sertraline (ZOLOFT) 100 MG tablet Take 100 mg by mouth daily.    [provider]  sulfamethoxazole-trimethoprim (BACTRIM DS) 800-160 MG tablet Take  1 tablet by mouth 2 (two) times daily. 06/07/16   Sharman Cheek, MD    Allergies Clarithromycin; Codeine; Influenza vaccines; Pioglitazone; Escitalopram oxalate; and Penicillins  Family History  Problem Relation Age of Onset  . Diabetes Mother   . Hypertension Mother   . Asthma Mother   . Diabetes Father   . Asthma Father   . Hypertension Father   . Cancer Father        brain  . CAD Father     Social History Social History   Tobacco Use  . Smoking status: Never Smoker  . Smokeless tobacco: Never Used  Substance Use Topics  . Alcohol use: No   . Drug use: Not on file    Review of Systems  Constitutional: No fever/chills Eyes: No visual changes. ENT: No sore throat. Cardiovascular: Denies chest pain. Respiratory: Denies shortness of breath. Gastrointestinal: Positive for abdominal pain, nausea, vomiting and diarrhea.  No constipation. Genitourinary: Positive for dysuria. Musculoskeletal: Negative for back pain. Skin: Negative for rash. Neurological: Negative for headaches, focal weakness or numbness.   ____________________________________________   PHYSICAL EXAM:  VITAL SIGNS: ED Triage Vitals  Enc Vitals Group     BP 10/31/18 0055 (!) 152/96     Pulse Rate 10/31/18 0055 85     Resp 10/31/18 0055 16     Temp 10/31/18 0055 97.6 F (36.4 C)     Temp Source 10/31/18 0055 Oral     SpO2 10/31/18 0055 98 %     Weight 10/31/18 0048 280 lb (127 kg)     Height --      Head Circumference --      Peak Flow --      Pain Score 10/31/18 0048 8     Pain Loc --      Pain Edu? --      Excl. in GC? --     Constitutional: Alert and oriented. Well appearing and in mild acute distress. Eyes: Conjunctivae are normal. PERRL. EOMI. Head: Atraumatic. Nose: No congestion/rhinnorhea. Mouth/Throat: Mucous membranes are moist.  Oropharynx non-erythematous. Neck: No stridor.   Cardiovascular: Normal rate, regular rhythm. Grossly normal heart sounds.  Good peripheral circulation. Respiratory: Normal respiratory effort.  No retractions. Lungs CTAB. Gastrointestinal: Soft and mildly diffusely tender to palpation without rebound or guarding.  Small reducible umbilical hernia. No distention. No abdominal bruits. No CVA tenderness. Musculoskeletal: No lower extremity tenderness nor edema.  No joint effusions. Neurologic:  Normal speech and language. No gross focal neurologic deficits are appreciated. No gait instability. Skin:  Skin is warm, dry and intact. No rash noted. Psychiatric: Mood and affect are normal. Speech and behavior are  normal.  ____________________________________________   LABS (all labs ordered are listed, but only abnormal results are displayed)  Labs Reviewed  CBC WITH DIFFERENTIAL/PLATELET - Abnormal; Notable for the following components:      Result Value   Eosinophils Absolute 1.0 (*)    All other components within normal limits  COMPREHENSIVE METABOLIC PANEL - Abnormal; Notable for the following components:   Glucose, Bld 256 (*)    All other components within normal limits  LIPASE, BLOOD - Abnormal; Notable for the following components:   Lipase 55 (*)    All other components within normal limits  URINALYSIS, COMPLETE (UACMP) WITH MICROSCOPIC - Abnormal; Notable for the following components:   Color, Urine STRAW (*)    APPearance CLEAR (*)    Specific Gravity, Urine 1.036 (*)    Glucose, UA >=500 (*)  All other components within normal limits  ETHANOL  TROPONIN I   ____________________________________________  EKG  None ____________________________________________  RADIOLOGY  ED MD interpretation: No acute intra-abdominal abnormality; incidental right kidney stones noted  Official radiology report(s): Ct Abdomen Pelvis W Contrast  Result Date: 10/31/2018 CLINICAL DATA:  53 year old male with generalized abdominal pain. Nausea vomiting diarrhea and some dysuria. EXAM: CT ABDOMEN AND PELVIS WITH CONTRAST TECHNIQUE: Multidetector CT imaging of the abdomen and pelvis was performed using the standard protocol following bolus administration of intravenous contrast. CONTRAST:  OMNIPAQUE IOHEXOL 300 MG/ML  SOLN COMPARISON:  None. FINDINGS: Lower chest: The visualized lung bases are clear. No intra-abdominal free air or free fluid. Hepatobiliary: There is diffuse fatty infiltration of the liver. No intrahepatic biliary ductal dilatation. The gallbladder is unremarkable. Pancreas: Unremarkable. No pancreatic ductal dilatation or surrounding inflammatory changes. Spleen: Normal in size  without focal abnormality. Adrenals/Urinary Tract: The adrenal glands are unremarkable. There is a 3 mm nonobstructing right renal inferior pole calculus. No hydronephrosis. Faint punctate nonobstructing stone may be present in the proximal right ureter (series 2, image 49). Additional tiny nonobstructing stone versus artifact may be present in the distal right ureter (series 2, image 85). The left kidney and the left ureter appears unremarkable. There is symmetric enhancement and excretion of contrast by both kidneys. The urinary bladder is grossly unremarkable. Stomach/Bowel: There is moderate stool throughout the colon. There is no bowel obstruction or active inflammation. Normal appendix. Vascular/Lymphatic: The abdominal aorta and IVC are grossly unremarkable. No portal venous gas. There is no adenopathy. Reproductive: The prostate and seminal vesicles are grossly unremarkable. No pelvic mass. Other: Small fat containing umbilical hernia. No associated inflammatory changes. Musculoskeletal: Degenerative changes of the spine. No acute osseous pathology. IMPRESSION: 1. A 3 mm nonobstructing right renal inferior pole calculus. Faint punctate nonobstructing right ureteral calculi versus artifact. No hydronephrosis. 2. Mild fatty liver. 3. No bowel obstruction or active inflammation. Normal appendix. Electronically Signed   By: Elgie Collard M.D.   On: 10/31/2018 03:02    ____________________________________________   PROCEDURES  Procedure(s) performed (including Critical Care):  Procedures  CRITICAL CARE Performed by: Irean Hong   Total critical care time: 30 minutes  Critical care time was exclusive of separately billable procedures and treating other patients.  Critical care was necessary to treat or prevent imminent or life-threatening deterioration.  Critical care was time spent personally by me on the following activities: development of treatment plan with patient and/or surrogate as  well as nursing, discussions with consultants, evaluation of patient's response to treatment, examination of patient, obtaining history from patient or surrogate, ordering and performing treatments and interventions, ordering and review of laboratory studies, ordering and review of radiographic studies, pulse oximetry and re-evaluation of patient's condition. ____________________________________________   INITIAL IMPRESSION / ASSESSMENT AND PLAN / ED COURSE  As part of my medical decision making, I reviewed the following data within the electronic MEDICAL RECORD NUMBER Nursing notes reviewed and incorporated, Labs reviewed, EKG interpreted, Old chart reviewed, Radiograph reviewed and Notes from prior ED visits        53 year old male who presents with diffuse abdominal pain associated with N/V/D. Differential diagnosis includes, but is not limited to, acute appendicitis, renal colic, testicular torsion, urinary tract infection/pyelonephritis, prostatitis,  epididymitis, diverticulitis, small bowel obstruction or ileus, colitis, abdominal aortic aneurysm, gastroenteritis, hernia, etc.  Joel Norman was evaluated in Emergency Department on 10/31/2018 for the symptoms described in the history of present illness. He was  evaluated in the context of the global COVID-19 pandemic, which necessitated consideration that the patient might be at risk for infection with the SARS-CoV-2 virus that causes COVID-19. Institutional protocols and algorithms that pertain to the evaluation of patients at risk for COVID-19 are in a state of rapid change based on information released by regulatory bodies including the CDC and federal and state organizations. These policies and algorithms were followed during the patient's care in the ED.  Will obtain basic lab work, initiate IV fluid resuscitation.  Administer 50 mcg IV fentanyl for pain paired with 4 mg IV Zofran for nausea.  Will proceed with CT abdomen/pelvis to evaluate for  intra-abdominal etiology of patient's pain.   Clinical Course as of Oct 30 405  Thu Oct 31, 2018  9323 Updated patient on laboratory and CT results.  He is currently on ice chips.  He has provided urine specimen which we will sent to the lab.   [JS]  0407 Updated patient of urine result.  He tolerated ice chips without emesis.  Will discharge home with prescription for Zofran to use as needed.  Strict return precautions given.  Patient verbalizes understanding agrees with plan of care.  Of note, patient has not had any diarrhea while in the ED.   [JS]    Clinical Course User Index [JS] Irean Hong, MD     ____________________________________________   FINAL CLINICAL IMPRESSION(S) / ED DIAGNOSES  Final diagnoses:  Generalized abdominal pain  Nausea vomiting and diarrhea     ED Discharge Orders    None       Note:  This document was prepared using Dragon voice recognition software and may include unintentional dictation errors.   Irean Hong, MD 10/31/18 [phone]

## 2018-10-31 NOTE — Discharge Instructions (Addendum)
1.  You may take Zofran as needed for nausea. 2.  Clear liquids x12 hours, then BRAT diet x3 days, then slowly advance diet as tolerated. 3.  Return to the ER for worsening symptoms, persistent vomiting, difficulty breathing or other concerns. 

## 2018-10-31 NOTE — ED Triage Notes (Signed)
Pt in with co generalized abd pain that started yesterday, with n.v.d does have some dysuria.

## 2018-11-06 ENCOUNTER — Encounter: Payer: Self-pay | Admitting: Emergency Medicine

## 2018-11-06 ENCOUNTER — Other Ambulatory Visit: Payer: Self-pay

## 2018-11-06 ENCOUNTER — Emergency Department
Admission: EM | Admit: 2018-11-06 | Discharge: 2018-11-06 | Disposition: A | Discharge status: Home / Self Care | Payer: Medicare Other | Attending: Emergency Medicine | Admitting: Emergency Medicine

## 2018-11-06 DIAGNOSIS — I10 Essential (primary) hypertension: Secondary | ICD-10-CM | POA: Diagnosis not present

## 2018-11-06 DIAGNOSIS — S01312A Laceration without foreign body of left ear, initial encounter: Secondary | ICD-10-CM | POA: Diagnosis present

## 2018-11-06 DIAGNOSIS — Y929 Unspecified place or not applicable: Secondary | ICD-10-CM | POA: Insufficient documentation

## 2018-11-06 DIAGNOSIS — Y999 Unspecified external cause status: Secondary | ICD-10-CM | POA: Insufficient documentation

## 2018-11-06 DIAGNOSIS — E119 Type 2 diabetes mellitus without complications: Secondary | ICD-10-CM | POA: Diagnosis not present

## 2018-11-06 DIAGNOSIS — Z79899 Other long term (current) drug therapy: Secondary | ICD-10-CM | POA: Diagnosis not present

## 2018-11-06 DIAGNOSIS — Y9389 Activity, other specified: Secondary | ICD-10-CM | POA: Diagnosis not present

## 2018-11-06 DIAGNOSIS — W208XXA Other cause of strike by thrown, projected or falling object, initial encounter: Secondary | ICD-10-CM | POA: Insufficient documentation

## 2018-11-06 DIAGNOSIS — S40012A Contusion of left shoulder, initial encounter: Secondary | ICD-10-CM | POA: Diagnosis not present
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
## 2019-09-14 ENCOUNTER — Encounter: Payer: Self-pay | Admitting: Emergency Medicine

## 2019-09-14 ENCOUNTER — Emergency Department
Admission: EM | Admit: 2019-09-14 | Discharge: 2019-09-14 | Disposition: A | Discharge status: Home / Self Care | Payer: Medicare Other | Attending: Emergency Medicine | Admitting: Emergency Medicine

## 2019-09-14 ENCOUNTER — Other Ambulatory Visit: Payer: Self-pay

## 2019-09-14 ENCOUNTER — Emergency Department: Payer: Medicare Other

## 2019-09-14 DIAGNOSIS — R1310 Dysphagia, unspecified: Secondary | ICD-10-CM

## 2019-09-14 DIAGNOSIS — E119 Type 2 diabetes mellitus without complications: Secondary | ICD-10-CM | POA: Insufficient documentation

## 2019-09-14 DIAGNOSIS — Z7984 Long term (current) use of oral hypoglycemic drugs: Secondary | ICD-10-CM | POA: Diagnosis not present

## 2019-09-14 DIAGNOSIS — Z79899 Other long term (current) drug therapy: Secondary | ICD-10-CM | POA: Diagnosis not present

## 2019-09-14 DIAGNOSIS — I10 Essential (primary) hypertension: Secondary | ICD-10-CM | POA: Insufficient documentation

## 2019-09-14 DIAGNOSIS — R0989 Other specified symptoms and signs involving the circulatory and respiratory systems: Secondary | ICD-10-CM | POA: Diagnosis present

## 2019-09-14 DIAGNOSIS — R1319 Other dysphagia: Secondary | ICD-10-CM

## 2019-09-14 MED ORDER — GLUCAGON HCL RDNA (DIAGNOSTIC) 1 MG IJ SOLR
1.0000 mg | Freq: Once | INTRAMUSCULAR | Status: AC
  Administered 2019-09-14: 17:00:00 1 mg via INTRAMUSCULAR
  Filled 2019-09-14: qty 1

## 2019-09-14 NOTE — ED Provider Notes (Signed)
[facility] Emergency Department Provider Note ____________________________________________   First MD Initiated Contact with Patient 09/14/19 1646     (approximate)  I have reviewed the triage vital signs and the nursing notes.   HISTORY  Chief Complaint Foreign Body    HPI Joel Norman is a 54 y.o. male with PMH as noted below who presents with inability to swallow after he ate a salad with steak and cucumber in it.  He states that he felt like he was choking and now feels a sensation that something is stuck in his throat or upper neck.  He is unable to swallow solids or liquids, and has to spit out his secretions.  He denies any shortness of breath or chest pain.  He denies any prior history of esophageal food impaction.  Past Medical History:  Diagnosis Date  . Anxiety   . Arachnoid cyst    causes headaches  . Diabetes mellitus without complication ([place])   . ED (erectile dysfunction)   . Elevated cholesterol   . Hypertension   . Microalbuminuria   . Migraines     Patient Active Problem List   Diagnosis Date Noted  . Elevated cholesterol   . Hypertension   . Diabetes mellitus without complication ([place])   . Anxiety   . Arachnoid cyst   . ED (erectile dysfunction)   . Migraines     Past Surgical History:  Procedure Laterality Date  . 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 FUNCTIONAL ENDOSCOPIC SINUS SURGERY      Prior to Admission medications   Medication Sig Start Date End Date Taking? Authorizing Provider  acetaminophen-codeine (TYLENOL #3) 300-30 MG per tablet Take 1 tablet by mouth every 6 (six) hours as needed for moderate pain. 02/26/15   Menshew, Dannielle Karvonen, PA-C  Alum & Mag Hydroxide-Simeth (MAGIC MOUTHWASH W/LIDOCAINE) SOLN Take 10 mLs by mouth 3 (three) times daily as needed for mouth pain. 02/26/15   Menshew, Dannielle Karvonen, PA-C  atenolol (TENORMIN) 25 MG tablet Take by mouth daily.    [provider]  cephALEXin  (KEFLEX) 500 MG capsule Take 1 capsule (500 mg total) by mouth 3 (three) times daily. 06/07/16   Carrie Mew, MD  furosemide (LASIX) 40 MG tablet Take 40 mg by mouth.    [provider]  glipiZIDE (GLUCOTROL) 10 MG tablet Take 10 mg by mouth daily before breakfast.    [provider]  lisinopril (PRINIVIL,ZESTRIL) 10 MG tablet Take 10 mg by mouth 2 (two) times daily.    [provider]  metFORMIN (GLUCOPHAGE) 1000 MG tablet Take 1,000 mg by mouth 2 (two) times daily with a meal.    [provider]  methocarbamol (ROBAXIN) 750 MG tablet Take 1 tablet (750 mg total) by mouth 4 (four) times daily. 05/13/17   Fisher, Linden Dolin, PA-C  naproxen (NAPROSYN) 500 MG tablet Take 1 tablet (500 mg total) by mouth 2 (two) times daily with a meal. 06/07/16   Carrie Mew, MD  ondansetron (ZOFRAN ODT) 4 MG disintegrating tablet Take 1 tablet (4 mg total) by mouth every 8 (eight) hours as needed for nausea or vomiting. 10/31/18   Paulette Blanch, MD  oxyCODONE-acetaminophen (ROXICET) 5-325 MG per tablet Take 1 tablet by mouth every 4 (four) hours as needed for severe pain. 03/03/15   Nance Pear, MD  sertraline (ZOLOFT) 100 MG tablet Take 100 mg by mouth daily.    [provider]  sulfamethoxazole-trimethoprim (BACTRIM  DS) 800-160 MG tablet Take 1 tablet by mouth 2 (two) times daily. 06/07/16   Sharman Cheek, MD    Allergies Clarithromycin, Codeine, Influenza vaccines, Pioglitazone, Escitalopram oxalate, and Penicillins  Family History  Problem Relation Age of Onset  . Diabetes Mother   . Hypertension Mother   . Asthma Mother   . Diabetes Father   . Asthma Father   . Hypertension Father   . Cancer Father        brain  . CAD Father     Social History Social History   Tobacco Use  . Smoking status: Never Smoker  . Smokeless tobacco: Never Used  Substance Use Topics  . Alcohol use: No  . Drug use: Not on file    Review of  Systems  Constitutional: No fever. Eyes: No redness. ENT: Positive for dysphagia.  No throat pain. Cardiovascular: Denies chest pain. Respiratory: Denies shortness of breath. Gastrointestinal: Positive for nausea.  No vomiting. Genitourinary: Negative for flank pain.  Musculoskeletal: Negative for back pain. Skin: Negative for rash. Neurological: Negative for headache.   ____________________________________________   PHYSICAL EXAM:  VITAL SIGNS: ED Triage Vitals  Enc Vitals Group     BP 09/14/19 1552 140/86     Pulse Rate 09/14/19 1552 (!) 115     Resp 09/14/19 1552 18     Temp 09/14/19 1552 98.8 F (37.1 C)     Temp src --      SpO2 09/14/19 1552 96 %     Weight 09/14/19 1553 240 lb (108.9 kg)     Height 09/14/19 1553 6' (1.829 m)     Head Circumference --      Peak Flow --      Pain Score 09/14/19 1553 0     Pain Loc --      Pain Edu? --      Excl. in GC? --     Constitutional: Alert and oriented. Well appearing and in no acute distress. Eyes: Conjunctivae are normal.  Head: Atraumatic. Nose: No congestion/rhinnorhea. Mouth/Throat: Mucous membranes are moist.  Oropharynx clear with no erythema or exudate. Neck: Normal range of motion.  Cardiovascular:   Good peripheral circulation. Respiratory: Normal respiratory effort.  No retractions.  Gastrointestinal: No distention.  Musculoskeletal: Extremities warm and well perfused.  Neurologic:  Normal speech and language. No gross focal neurologic deficits are appreciated.  Skin:  Skin is warm and dry. No rash noted. Psychiatric: Mood and affect are normal. Speech and behavior are normal.  ____________________________________________   LABS (all labs ordered are listed, but only abnormal results are displayed)  Labs Reviewed - No data to display ____________________________________________  EKG   ____________________________________________  RADIOLOGY  CXR: No acute  abnormality  ____________________________________________   PROCEDURES  Procedure(s) performed: No  Procedures  Critical Care performed: No ____________________________________________   INITIAL IMPRESSION / ASSESSMENT AND PLAN / ED COURSE  Pertinent labs & imaging results that were available during my care of the patient were reviewed by me and considered in my medical decision making (see chart for details).  54 year old male with PMH as noted above including history of diabetes presents with difficulty swallowing and a foreign body sensation in his throat/neck after eating a salad with steak in it.  He states that he typically eats quite quickly.  He has no prior history of esophageal food impaction.  On exam the patient is overall well-appearing and his vital signs are normal except for mild tachycardia, although he endorses anxiety.  Physical  exam is otherwise unremarkable.  His oropharynx is clear.  Overall presentation is consistent with esophageal food impaction.  We will give a dose of IM Decadron, obtain a chest x-ray, and reassess.  If he has no relief and still not tolerating p.o. solids or liquids, I will consult GI for endoscopy.  ----------------------------------------- 5:55 PM on 09/14/2019 -----------------------------------------  The patient reports complete resolution of his symptoms after the glucagon.  He is now able to drink without difficulty.  He feels comfortable and would like to go home.  He is stable for discharge at this time.  Return precautions given, and he expresses understanding.  ____________________________________________   FINAL CLINICAL IMPRESSION(S) / ED DIAGNOSES  Final diagnoses:  Esophageal dysphagia      NEW MEDICATIONS STARTED DURING THIS VISIT:  New Prescriptions   No medications on file     Note:  This document was prepared using Dragon voice recognition software and may include unintentional dictation errors.     Dionne Bucy, MD 09/14/19 1755

## 2019-09-14 NOTE — ED Notes (Signed)
Hallway bed, no signature pad available

## 2019-09-14 NOTE — ED Triage Notes (Signed)
Pt states feels like something is stuck in his throat, unable to swallow secretions. Pt spitting occasionally in triage.

## 2019-09-14 NOTE — Discharge Instructions (Addendum)
You likely had a piece of steak stuck in your esophagus.  Return to the ER for any new, worsening, or persistent difficulty swallowing, nausea or vomiting, pain, shortness of breath, or any other new or worsening symptoms that concern you.

## 2019-09-14 NOTE — ED Notes (Signed)
Pt says he has not been able to completely swallow his food, and like a piece of cucumber is stuck in his throat. Pt is spitting, and claims he cannot swallow his saliva. Pt is stable NAD able to talk freely at this time. Per pt, this is not the first time this has happened, but the last time he had been able to swallow the obstruction after a while

## 2019-10-29 IMAGING — CT CT ABDOMEN AND PELVIS WITH CONTRAST
2 of 5 series · 16 of 46 positions shown, 18 images · IV contrast (APPLIED)
Comparison: None.

CLINICAL DATA: 52-year-old male with generalized abdominal pain.
Nausea vomiting diarrhea and some dysuria.

EXAM:
CT ABDOMEN AND PELVIS WITH CONTRAST
TECHNIQUE: Multidetector CT imaging of the abdomen and pelvis was performed
using the standard protocol following bolus administration of
intravenous contrast.
CONTRAST:  100mL OMNIPAQUE IOHEXOL 300 MG/ML  SOLN

[Series 2: routine abd/pel with · axial · 0.94mm/px · z∈[-876,-396]mm · 13 of 108 slices shown, 15 images]
[im 6/108  soft-tissue]
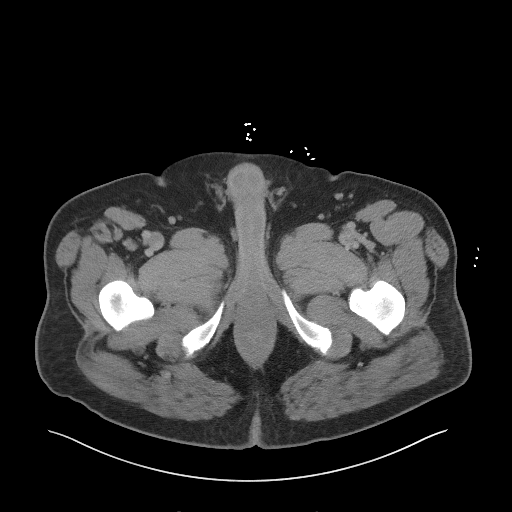
[im 6/108  bone]
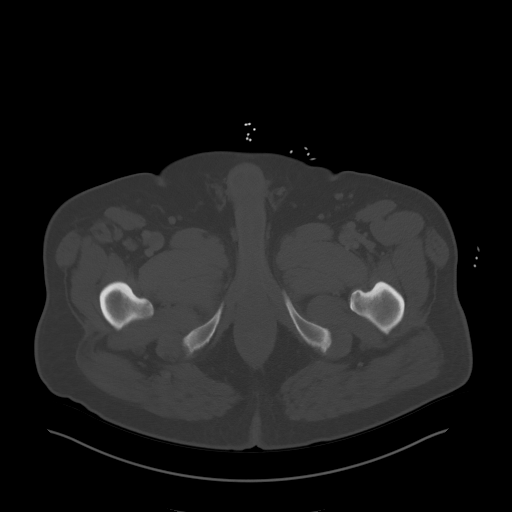
[im 12/108  soft-tissue]
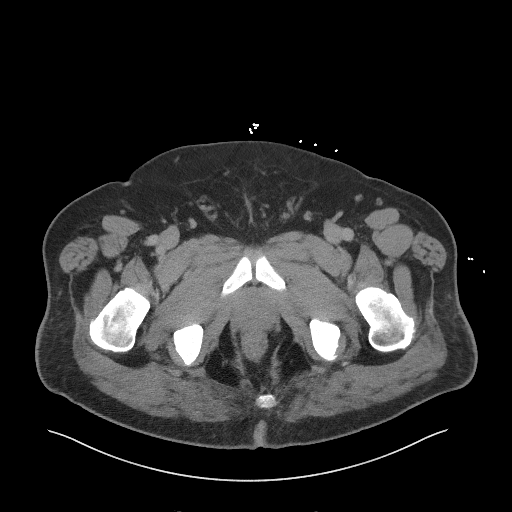
[im 24/108  soft-tissue]
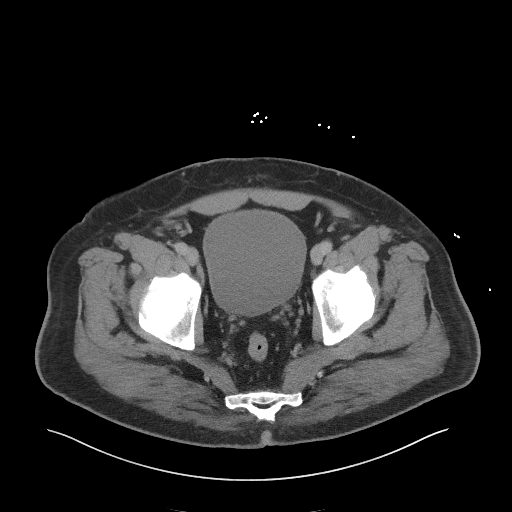
[im 30/108  soft-tissue]
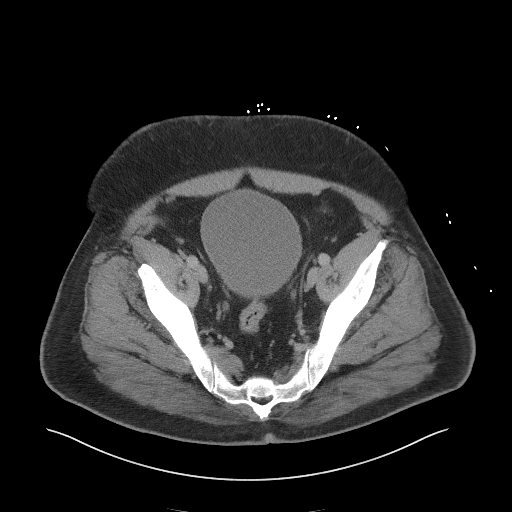
[im 36/108  soft-tissue]
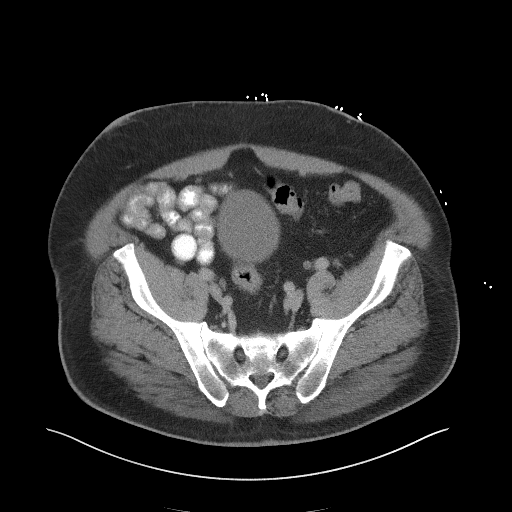
[im 48/108  soft-tissue]
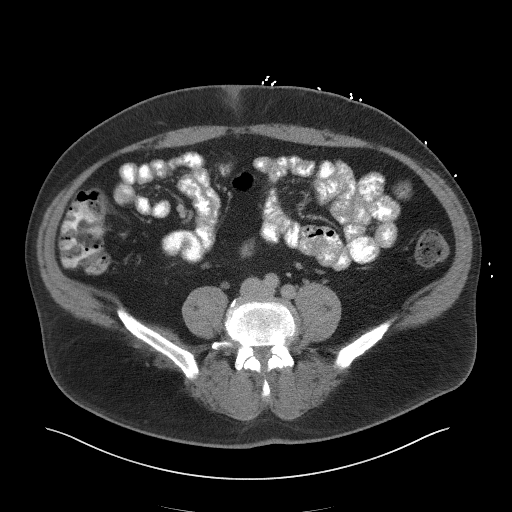
[im 54/108  soft-tissue]
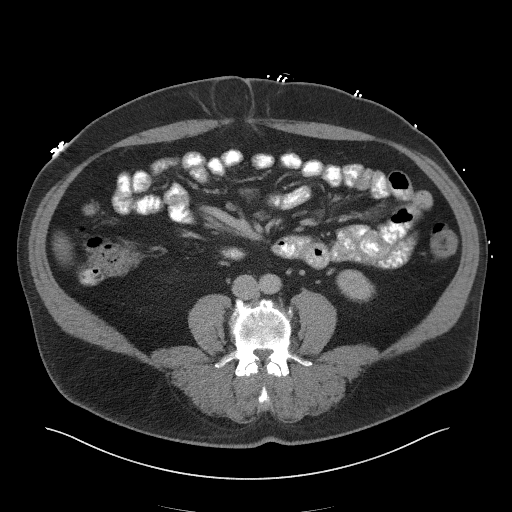
[im 60/108  soft-tissue]
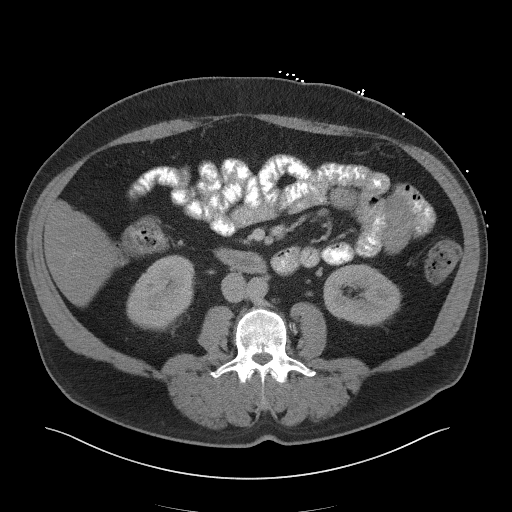
[im 72/108  soft-tissue]
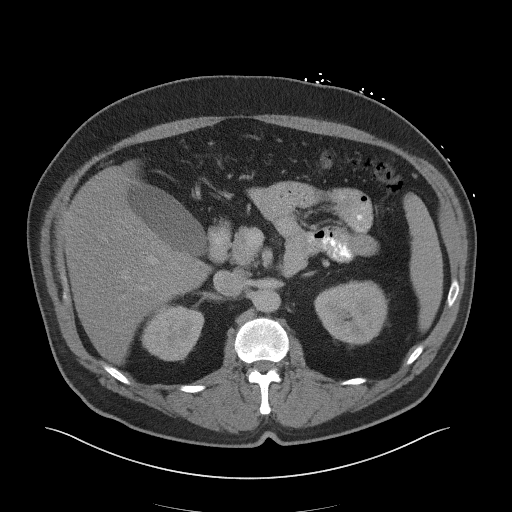
[im 72/108  bone]
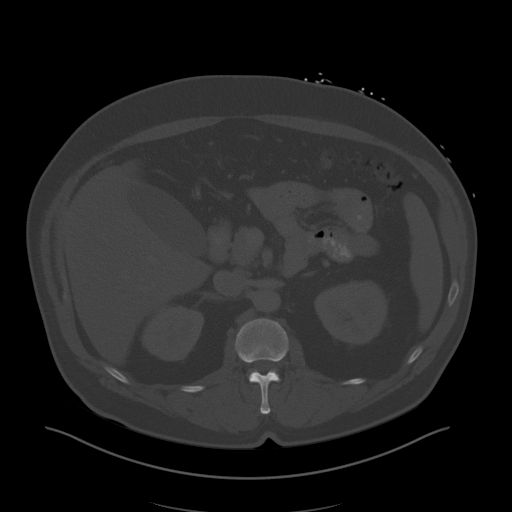
[im 78/108  soft-tissue]
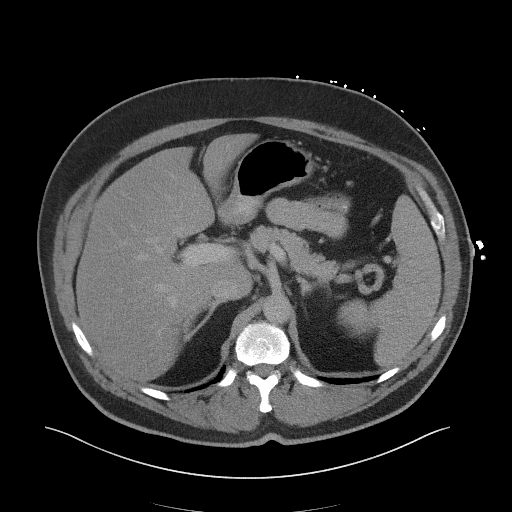
[im 84/108  soft-tissue]
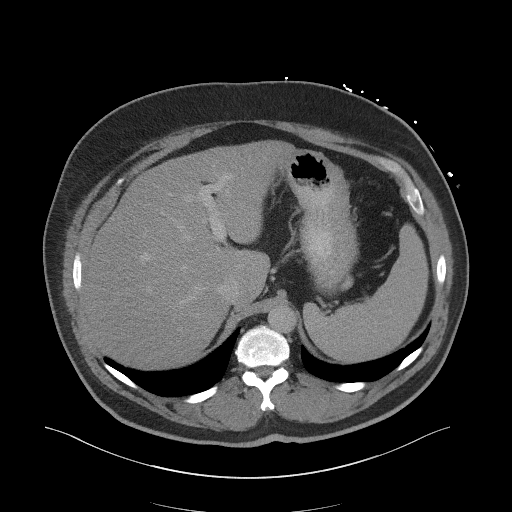
[im 96/108  soft-tissue]
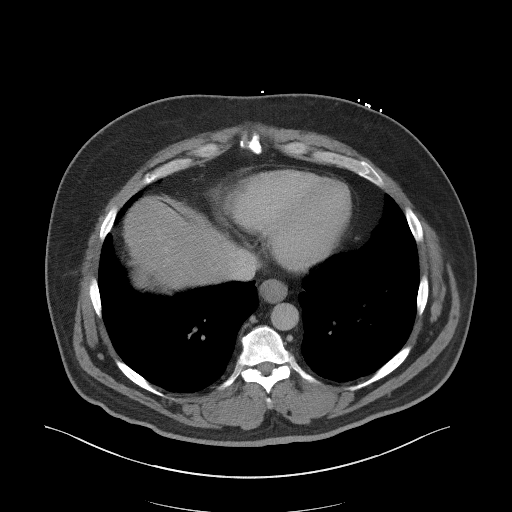
[im 102/108  soft-tissue]
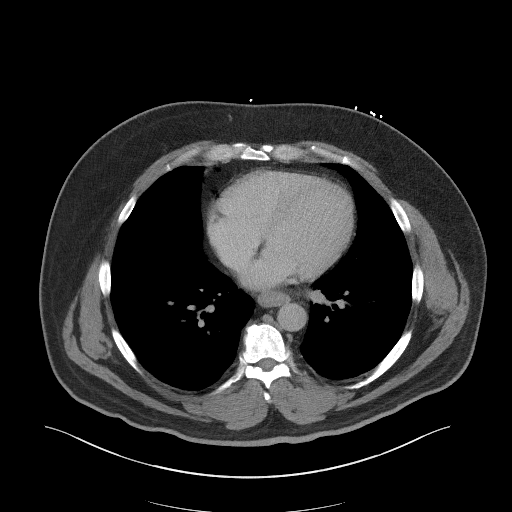

[Series 6: coronal st · coronal · 0.91mm/px · 3 of 107 slices shown]
[im 36/107  soft-tissue]
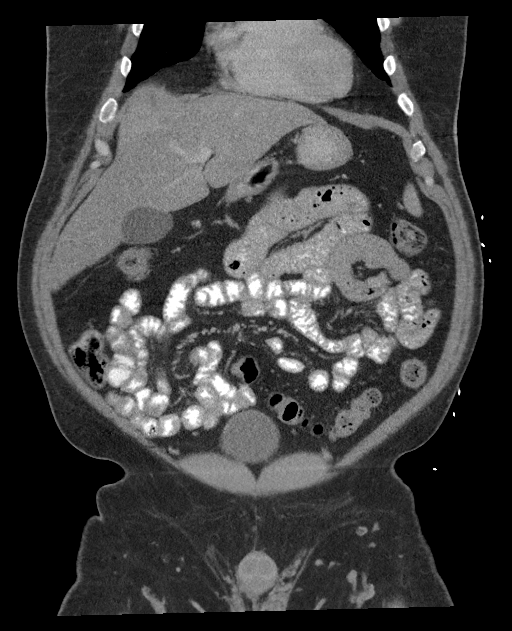
[im 48/107  soft-tissue]
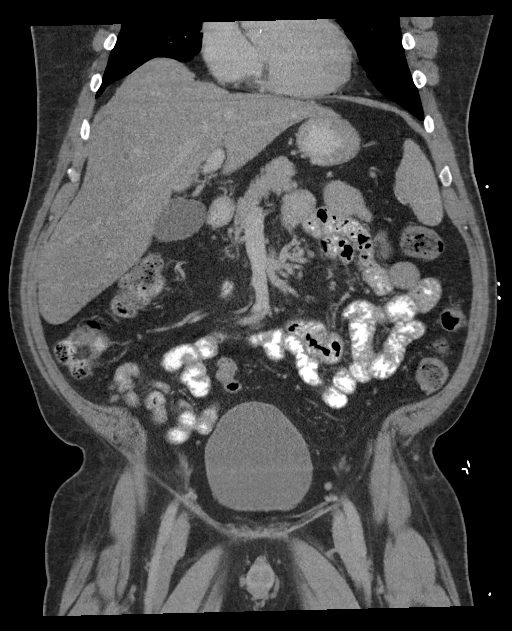
[im 59/107  soft-tissue]
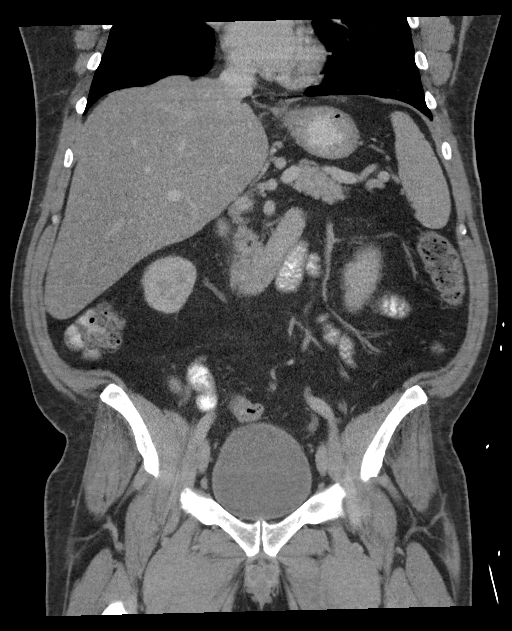

[16 of 46 positions shown; findings below may reference images not displayed]

FINDINGS: Lower chest: The visualized lung bases are clear.

No intra-abdominal free air or free fluid.

Hepatobiliary: There is diffuse fatty infiltration of the liver. No
intrahepatic biliary ductal dilatation. The gallbladder is
unremarkable.

Pancreas: Unremarkable. No pancreatic ductal dilatation or
surrounding inflammatory changes.

Spleen: Normal in size without focal abnormality.

Adrenals/Urinary Tract: The adrenal glands are unremarkable. There
is a 3 mm nonobstructing right renal inferior pole calculus. No
hydronephrosis. Faint punctate nonobstructing stone may be present
in the proximal right ureter (series 2, image 49). Additional tiny
nonobstructing stone versus artifact may be present in the distal
right ureter (series 2, image 85). The left kidney and the left
ureter appears unremarkable. There is symmetric enhancement and
excretion of contrast by both kidneys. The urinary bladder is
grossly unremarkable.

Stomach/Bowel: There is moderate stool throughout the colon. There
is no bowel obstruction or active inflammation. Normal appendix.

Vascular/Lymphatic: The abdominal aorta and IVC are grossly
unremarkable. No portal venous gas. There is no adenopathy.

Reproductive: The prostate and seminal vesicles are grossly
unremarkable. No pelvic mass.

Other: Small fat containing umbilical hernia. No associated
inflammatory changes.

Musculoskeletal: Degenerative changes of the spine. No acute osseous
pathology.
IMPRESSION: 1. A 3 mm nonobstructing right renal inferior pole calculus. Faint
punctate nonobstructing right ureteral calculi versus artifact. No
hydronephrosis.
2. Mild fatty liver.
3. No bowel obstruction or active inflammation. Normal appendix.

## 2019-11-03 ENCOUNTER — Emergency Department
Admission: EM | Admit: 2019-11-03 | Discharge: 2019-11-03 | Disposition: A | Discharge status: Home / Self Care | Payer: Medicare Other | Attending: Emergency Medicine | Admitting: Emergency Medicine

## 2019-11-03 ENCOUNTER — Other Ambulatory Visit: Payer: Self-pay

## 2019-11-03 DIAGNOSIS — Y929 Unspecified place or not applicable: Secondary | ICD-10-CM | POA: Insufficient documentation

## 2019-11-03 DIAGNOSIS — Z79899 Other long term (current) drug therapy: Secondary | ICD-10-CM | POA: Insufficient documentation

## 2019-11-03 DIAGNOSIS — Y999 Unspecified external cause status: Secondary | ICD-10-CM | POA: Insufficient documentation

## 2019-11-03 DIAGNOSIS — T18128A Food in esophagus causing other injury, initial encounter: Secondary | ICD-10-CM

## 2019-11-03 DIAGNOSIS — E119 Type 2 diabetes mellitus without complications: Secondary | ICD-10-CM | POA: Insufficient documentation

## 2019-11-03 DIAGNOSIS — I10 Essential (primary) hypertension: Secondary | ICD-10-CM | POA: Diagnosis not present

## 2019-11-03 DIAGNOSIS — R0989 Other specified symptoms and signs involving the circulatory and respiratory systems: Secondary | ICD-10-CM | POA: Diagnosis present

## 2019-11-03 DIAGNOSIS — Z7984 Long term (current) use of oral hypoglycemic drugs: Secondary | ICD-10-CM | POA: Insufficient documentation

## 2019-11-03 DIAGNOSIS — Y9389 Activity, other specified: Secondary | ICD-10-CM | POA: Diagnosis not present

## 2019-11-03 DIAGNOSIS — X58XXXA Exposure to other specified factors, initial encounter: Secondary | ICD-10-CM | POA: Diagnosis not present

## 2019-11-03 DIAGNOSIS — K222 Esophageal obstruction: Secondary | ICD-10-CM

## 2019-11-03 MED ORDER — GLUCAGON HCL RDNA (DIAGNOSTIC) 1 MG IJ SOLR
1.0000 mg | Freq: Once | INTRAMUSCULAR | Status: AC
  Administered 2019-11-03: 1 mg via INTRAVENOUS
  Filled 2019-11-03: qty 1

## 2019-11-03 NOTE — ED Provider Notes (Signed)
[facility] Emergency Department Provider Note  ____________________________________________   First MD Initiated Contact with Patient 11/03/19 2057     (approximate)  I have reviewed the triage vital signs and the nursing notes.   HISTORY  Chief Complaint Choking    HPI Joel Norman is a 54 y.o. male          Past Medical History:  Diagnosis Date  . Anxiety   . Arachnoid cyst    causes headaches  . Diabetes mellitus without complication ([facility])   . ED (erectile dysfunction)   . Elevated cholesterol   . Hypertension   . Microalbuminuria   . Migraines     Patient Active Problem List   Diagnosis Date Noted  . Elevated cholesterol   . Hypertension   . Diabetes mellitus without complication ([facility])   . Anxiety   . Arachnoid cyst   . ED (erectile dysfunction)   . Migraines     Past Surgical History:  Procedure Laterality Date  . 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 FUNCTIONAL ENDOSCOPIC SINUS SURGERY      Prior to Admission medications   Medication Sig Start Date End Date Taking? Authorizing Provider  acetaminophen-codeine (TYLENOL #3) 300-30 MG per tablet Take 1 tablet by mouth every 6 (six) hours as needed for moderate pain. 02/26/15   Menshew, Charlesetta Ivory, PA-C  Alum & Mag Hydroxide-Simeth (MAGIC MOUTHWASH W/LIDOCAINE) SOLN Take 10 mLs by mouth 3 (three) times daily as needed for mouth pain. 02/26/15   Menshew, Charlesetta Ivory, PA-C  atenolol (TENORMIN) 25 MG tablet Take by mouth daily.    [provider]  cephALEXin (KEFLEX) 500 MG capsule Take 1 capsule (500 mg total) by mouth 3 (three) times daily. 06/07/16   Sharman Cheek, MD  furosemide (LASIX) 40 MG tablet Take 40 mg by mouth.    [provider]  glipiZIDE (GLUCOTROL) 10 MG tablet Take 10 mg by mouth daily before breakfast.    [provider]  lisinopril (PRINIVIL,ZESTRIL) 10 MG tablet Take 10 mg by mouth 2 (two) times daily.    [provider]  metFORMIN (GLUCOPHAGE) 1000 MG tablet Take 1,000 mg by mouth 2 (two) times daily with a meal.    [provider]  methocarbamol (ROBAXIN) 750 MG tablet Take 1 tablet (750 mg total) by mouth 4 (four) times daily. 05/13/17   Fisher, Roselyn Bering, PA-C  naproxen (NAPROSYN) 500 MG tablet Take 1 tablet (500 mg total) by mouth 2 (two) times daily with a meal. 06/07/16   Sharman Cheek, MD  ondansetron (ZOFRAN ODT) 4 MG disintegrating tablet Take 1 tablet (4 mg total) by mouth every 8 (eight) hours as needed for nausea or vomiting. 10/31/18   Irean Hong, MD  oxyCODONE-acetaminophen (ROXICET) 5-325 MG per tablet Take 1 tablet by mouth every 4 (four) hours as needed for severe pain. 03/03/15   Phineas Semen, MD  sertraline (ZOLOFT) 100 MG tablet Take 100 mg by mouth daily.    [provider]  sulfamethoxazole-trimethoprim (BACTRIM DS) 800-160 MG tablet Take 1 tablet by mouth 2 (two) times daily. 06/07/16   Sharman Cheek, MD    Allergies Clarithromycin, Codeine, Influenza vaccines, Pioglitazone, Escitalopram oxalate, and Penicillins  Family History  Problem Relation Age of Onset  . Diabetes Mother   . Hypertension Mother   . Asthma Mother   . Diabetes Father   . Asthma Father   . Hypertension Father   . Cancer Father  brain  . CAD Father     Social History Social History   Tobacco Use  . Smoking status: Never Smoker  . Smokeless tobacco: Never Used  Substance Use Topics  . Alcohol use: No  . Drug use: Not on file      Review of Systems Constitutional: No fever/chills Eyes: No visual changes. ENT: No sore throat. Cardiovascular: Denies chest pain. Respiratory: Denies shortness of breath. Gastrointestinal: No abdominal pain.  No nausea, no vomiting.  No diarrhea.  No constipation. Genitourinary: Negative for dysuria. Musculoskeletal: Negative for back pain. Skin: Negative for rash. Neurological: Negative for headaches, focal weakness or numbness. All  other ROS negative ____________________________________________   PHYSICAL EXAM:  VITAL SIGNS: ED Triage Vitals  Enc Vitals Group     BP 11/03/19 1929 (!) 156/87     Pulse Rate 11/03/19 1929 (!) 106     Resp 11/03/19 1929 18     Temp 11/03/19 1929 98.7 F (37.1 C)     Temp Source 11/03/19 1929 Oral     SpO2 11/03/19 1929 99 %     Weight 11/03/19 1926 262 lb (118.8 kg)     Height 11/03/19 1926 6' (1.829 m)     Head Circumference --      Peak Flow --      Pain Score 11/03/19 1926 0     Pain Loc --      Pain Edu? --      Excl. in GC? --     Constitutional: Alert and oriented. Well appearing and in no acute distress. Eyes: Conjunctivae are normal. EOMI. Head: Atraumatic. Nose: No congestion/rhinnorhea. Mouth/Throat: Mucous membranes are moist.   Neck: No stridor. Trachea Midline. FROM Cardiovascular: Normal rate, regular rhythm. Grossly normal heart sounds.  Good peripheral circulation. Respiratory: Normal respiratory effort.  No retractions. Lungs CTAB. Gastrointestinal: Soft and nontender. No distention. No abdominal bruits.  Musculoskeletal: No lower extremity tenderness nor edema.  No joint effusions. Neurologic:  Normal speech and language. No gross focal neurologic deficits are appreciated.  Skin:  Skin is warm, dry and intact. No rash noted. Psychiatric: Mood and affect are normal. Speech and behavior are normal. GU: Deferred   ____________________________________________   LABS (all labs ordered are listed, but only abnormal results are displayed)  Labs Reviewed - No data to display ____________________________________________   ED ECG REPORT I, Concha Se, the attending physician, personally viewed and interpreted this ECG.  EKG sinus tachycardia rate of 102, incomplete right bundle branch with a left anterior fascicular block, no st ele, no T wave inversions.  Looks similar to prior EKGs ____________________________________________     INITIAL  IMPRESSION / ASSESSMENT AND PLAN / ED COURSE  Joel Norman was evaluated in Emergency Department on 11/03/2019 for the symptoms described in the history of present illness. He was evaluated in the context of the global COVID-19 pandemic, which necessitated consideration that the patient might be at risk for infection with the SARS-CoV-2 virus that causes COVID-19. Institutional protocols and algorithms that pertain to the evaluation of patients at risk for COVID-19 are in a state of rapid change based on information released by regulatory bodies including the CDC and federal and state organizations. These policies and algorithms were followed during the patient's care in the ED.    Patient comes in with food impaction.  This is most likely secondary to patient not chewing his food correctly and his partials.  Patient could have stricture versus mass.  No significant pain  to suggest perforation.  Will trial a dose of glucagon  Went to call GI telling them that the patient was unable to pass it and then I got a call out that patient was able to pass the food bolus.  Patient is now tolerating p.o. and is ready to go home.  Patient was watched for additional few minutes but was in the process of wanting to take his IV out so patient is discharged off was placed.  Patient was given GIs number for follow-up.       ____________________________________________   FINAL CLINICAL IMPRESSION(S) / ED DIAGNOSES   Final diagnoses:  Esophageal obstruction due to food impaction      MEDICATIONS GIVEN DURING THIS VISIT:  Medications  glucagon (human recombinant) (GLUCAGEN) injection 1 mg (1 mg Intravenous Given 11/03/19 1932)     ED Discharge Orders    None       Note:  This document was prepared using Dragon voice recognition software and may include unintentional dictation errors.   Vanessa Progress, MD 11/03/19 2137

## 2019-11-03 NOTE — ED Notes (Signed)
Pt producing excessive saliva, but NAD and not actively choking. Airway patent.   Pt seen previously for same situation, and received glucagon injection, which cleared the issue almost immediately

## 2019-11-03 NOTE — Discharge Instructions (Addendum)
You should stop eating steaks.  Eat soft foods and chew your food completely.  He can follow-up with GI.

## 2019-11-03 NOTE — ED Triage Notes (Signed)
Pt arrives c/o choking. Pt was eating steak for dinner and now has the sensation of it being stuck in throat. Pt states this has happened in the past, no difficulty breathing pt talking in complete sentences without difficulty.

## 2020-04-21 ENCOUNTER — Other Ambulatory Visit: Payer: Self-pay

## 2020-04-21 ENCOUNTER — Emergency Department
Admission: EM | Admit: 2020-04-21 | Discharge: 2020-04-21 | Disposition: A | Discharge status: Home / Self Care | Payer: Medicare Other | Attending: Emergency Medicine | Admitting: Emergency Medicine

## 2020-04-21 ENCOUNTER — Encounter: Payer: Self-pay | Admitting: *Deleted

## 2020-04-21 DIAGNOSIS — E119 Type 2 diabetes mellitus without complications: Secondary | ICD-10-CM | POA: Insufficient documentation

## 2020-04-21 DIAGNOSIS — Z79899 Other long term (current) drug therapy: Secondary | ICD-10-CM | POA: Insufficient documentation

## 2020-04-21 DIAGNOSIS — I10 Essential (primary) hypertension: Secondary | ICD-10-CM | POA: Insufficient documentation

## 2020-04-21 DIAGNOSIS — W458XXA Other foreign body or object entering through skin, initial encounter: Secondary | ICD-10-CM | POA: Insufficient documentation

## 2020-04-21 DIAGNOSIS — Z7984 Long term (current) use of oral hypoglycemic drugs: Secondary | ICD-10-CM | POA: Insufficient documentation

## 2020-04-21 DIAGNOSIS — Z20822 Contact with and (suspected) exposure to covid-19: Secondary | ICD-10-CM | POA: Insufficient documentation

## 2020-04-21 DIAGNOSIS — T18128A Food in esophagus causing other injury, initial encounter: Secondary | ICD-10-CM | POA: Insufficient documentation

## 2020-04-21 LAB — SARS CORONAVIRUS 2 BY RT PCR (HOSPITAL ORDER, PERFORMED IN ~~LOC~~ HOSPITAL LAB): SARS Coronavirus 2: NEGATIVE

## 2020-04-21 MED ORDER — GLUCAGON HCL RDNA (DIAGNOSTIC) 1 MG IJ SOLR: 1.0000 mg | Freq: Once | INTRAMUSCULAR | Status: AC

## 2020-04-21 MED ORDER — GLUCAGON HCL RDNA (DIAGNOSTIC) 1 MG IJ SOLR
INTRAMUSCULAR | Status: AC
  Administered 2020-04-21: 1 mg via INTRAVENOUS
  Filled 2020-04-21: qty 1

## 2020-04-21 NOTE — ED Notes (Signed)
Pt now states the chicken caught in throat has passed. Pt taken multiple sips of soda without issue.

## 2020-04-21 NOTE — Discharge Instructions (Addendum)
Please seek medical attention for any high fevers, chest pain, shortness of breath, change in behavior, persistent vomiting, bloody stool or any other new or concerning symptoms.  

## 2020-04-21 NOTE — ED Notes (Signed)
Pt to room 1h from triage.

## 2020-04-21 NOTE — ED Provider Notes (Signed)
[facility] Emergency Department Provider Note   ____________________________________________   I have reviewed the triage vital signs and the nursing notes.   HISTORY  Chief Complaint Foreign Body   History limited by: Not Limited   HPI Joel Norman is a 54 y.o. male who presents to the emergency department today because of concern of having a piece of chicken stuck in his esophagus.  States this happened while eating earlier today.  He states that he had this happen to him once in the past with a piece of steak.  Feels like he eats too quick and does not always remember to treat completely.  Patient has not seen a GI doctor about this problem.  States he does have a history of acid reflux.   Records reviewed. Per medical record review patient has a history of esophageal obstruction.  Past Medical History:  Diagnosis Date  . Anxiety   . Arachnoid cyst    causes headaches  . Diabetes mellitus without complication (HCC)   . ED (erectile dysfunction)   . Elevated cholesterol   . Hypertension   . Microalbuminuria   . Migraines     Patient Active Problem List   Diagnosis Date Noted  . Elevated cholesterol   . Hypertension   . Diabetes mellitus without complication (HCC)   . Anxiety   . Arachnoid cyst   . ED (erectile dysfunction)   . Migraines     Past Surgical History:  Procedure Laterality Date  . 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 FUNCTIONAL ENDOSCOPIC SINUS SURGERY      Prior to Admission medications   Medication Sig Start Date End Date Taking? Authorizing Provider  acetaminophen-codeine (TYLENOL #3) 300-30 MG per tablet Take 1 tablet by mouth every 6 (six) hours as needed for moderate pain. 02/26/15   Menshew, Charlesetta Ivory, PA-C  Alum & Mag Hydroxide-Simeth (MAGIC MOUTHWASH W/LIDOCAINE) SOLN Take 10 mLs by mouth 3 (three) times daily as needed for mouth pain. 02/26/15   Menshew, Charlesetta Ivory, PA-C  atenolol (TENORMIN) 25 MG tablet Take  by mouth daily.    [provider]  cephALEXin (KEFLEX) 500 MG capsule Take 1 capsule (500 mg total) by mouth 3 (three) times daily. 06/07/16   Sharman Cheek, MD  furosemide (LASIX) 40 MG tablet Take 40 mg by mouth.    [provider]  glipiZIDE (GLUCOTROL) 10 MG tablet Take 10 mg by mouth daily before breakfast.    [provider]  lisinopril (PRINIVIL,ZESTRIL) 10 MG tablet Take 10 mg by mouth 2 (two) times daily.    [provider]  metFORMIN (GLUCOPHAGE) 1000 MG tablet Take 1,000 mg by mouth 2 (two) times daily with a meal.    [provider]  methocarbamol (ROBAXIN) 750 MG tablet Take 1 tablet (750 mg total) by mouth 4 (four) times daily. 05/13/17   Fisher, Roselyn Bering, PA-C  naproxen (NAPROSYN) 500 MG tablet Take 1 tablet (500 mg total) by mouth 2 (two) times daily with a meal. 06/07/16   Sharman Cheek, MD  ondansetron (ZOFRAN ODT) 4 MG disintegrating tablet Take 1 tablet (4 mg total) by mouth every 8 (eight) hours as needed for nausea or vomiting. 10/31/18   Irean Hong, MD  oxyCODONE-acetaminophen (ROXICET) 5-325 MG per tablet Take 1 tablet by mouth every 4 (four) hours as needed for severe pain. 03/03/15   Phineas Semen, MD  sertraline (ZOLOFT) 100 MG tablet Take 100 mg by mouth daily.  [provider]  sulfamethoxazole-trimethoprim (BACTRIM DS) 800-160 MG tablet Take 1 tablet by mouth 2 (two) times daily. 06/07/16   Sharman Cheek, MD    Allergies Clarithromycin, Codeine, Influenza vaccines, Pioglitazone, Escitalopram oxalate, and Penicillins  Family History  Problem Relation Age of Onset  . Diabetes Mother   . Hypertension Mother   . Asthma Mother   . Diabetes Father   . Asthma Father   . Hypertension Father   . Cancer Father        brain  . CAD Father     Social History Social History   Tobacco Use  . Smoking status: Never Smoker  . Smokeless tobacco: Never Used  Substance Use Topics  . Alcohol use: No  .  Drug use: Not on file    Review of Systems Constitutional: No fever/chills Eyes: No visual changes. ENT: No sore throat. Cardiovascular: Denies chest pain. Respiratory: Denies shortness of breath. Gastrointestinal: Piece of chicken stuck in his esophagus.  Genitourinary: Negative for dysuria. Musculoskeletal: Negative for back pain. Skin: Negative for rash. Neurological: Negative for headaches, focal weakness or numbness.  ____________________________________________   PHYSICAL EXAM:  VITAL SIGNS: ED Triage Vitals  Enc Vitals Group     BP 04/21/20 1812 (!) 171/92     Pulse Rate 04/21/20 1812 100     Resp 04/21/20 1812 20     Temp 04/21/20 1812 98.4 F (36.9 C)     Temp Source 04/21/20 1812 Oral     SpO2 04/21/20 1812 99 %     Weight 04/21/20 1815 230 lb (104.3 kg)     Height 04/21/20 1815 6' (1.829 m)     Head Circumference --      Peak Flow --      Pain Score 04/21/20 1834 0   Constitutional: Alert and oriented.  Eyes: Conjunctivae are normal.  ENT      Head: Normocephalic and atraumatic.      Nose: No congestion/rhinnorhea.      Mouth/Throat: Mucous membranes are moist.      Neck: No stridor. Hematological/Lymphatic/Immunilogical: No cervical lymphadenopathy. Cardiovascular: Normal rate, regular rhythm.  No murmurs, rubs, or gallops.  Respiratory: Normal respiratory effort without tachypnea nor retractions. Breath sounds are clear and equal bilaterally. No wheezes/rales/rhonchi. Gastrointestinal: Soft and non tender. No rebound. No guarding.  Genitourinary: Deferred Musculoskeletal: Normal range of motion in all extremities. No lower extremity edema. Neurologic:  Normal speech and language. No gross focal neurologic deficits are appreciated.  Skin:  Skin is warm, dry and intact. No rash noted. Psychiatric: Mood and affect are normal. Speech and behavior are normal. Patient exhibits appropriate insight and judgment.  ____________________________________________     LABS (pertinent positives/negatives)  None  ____________________________________________   EKG  None  ____________________________________________    RADIOLOGY  None  ____________________________________________   PROCEDURES  Procedures  ____________________________________________   INITIAL IMPRESSION / ASSESSMENT AND PLAN / ED COURSE  Pertinent labs & imaging results that were available during my care of the patient were reviewed by me and considered in my medical decision making (see chart for details).   Patient presented to the emergency department today because of concerns for piece of chicken stuck in his throat.  This not for some the patient has had a food impaction.  He states that within 5 minutes of receiving the glucagon he felt the chicken passed.  He has since been able to drink fluids without issues.  I discussed with the patient importance of following up with  GI. ____________________________________________   FINAL CLINICAL IMPRESSION(S) / ED DIAGNOSES  Final diagnoses:  Food impaction of esophagus, initial encounter     Note: This dictation was prepared with Dragon dictation. Any transcriptional errors that result from this process are unintentional     Phineas Semen, MD 04/21/20 2010

## 2020-04-21 NOTE — ED Notes (Signed)
Pt presents to the ED, pt swallowed a piece of chicken but says it is stuck in his throat. Denies pain pt states that it is just "annoying." Pt is A&Ox4 and NAD at this time. O2 level 100% at this time. Pt does have raspy tone to his voice.

## 2020-04-21 NOTE — ED Triage Notes (Signed)
Pt has a piece of chicken hung in throat for 30 minutes.  Pt spitting up saliva.  No resp distress.  Pt alert.

## 2020-09-11 IMAGING — CR DG CHEST 2V
2 series · 2 of 2 positions shown · non-contrast
Comparison: Chest x-ray dated 01/16/2014.

CLINICAL DATA: Dysphagia

EXAM:
CHEST - 2 VIEW

[chest pa]
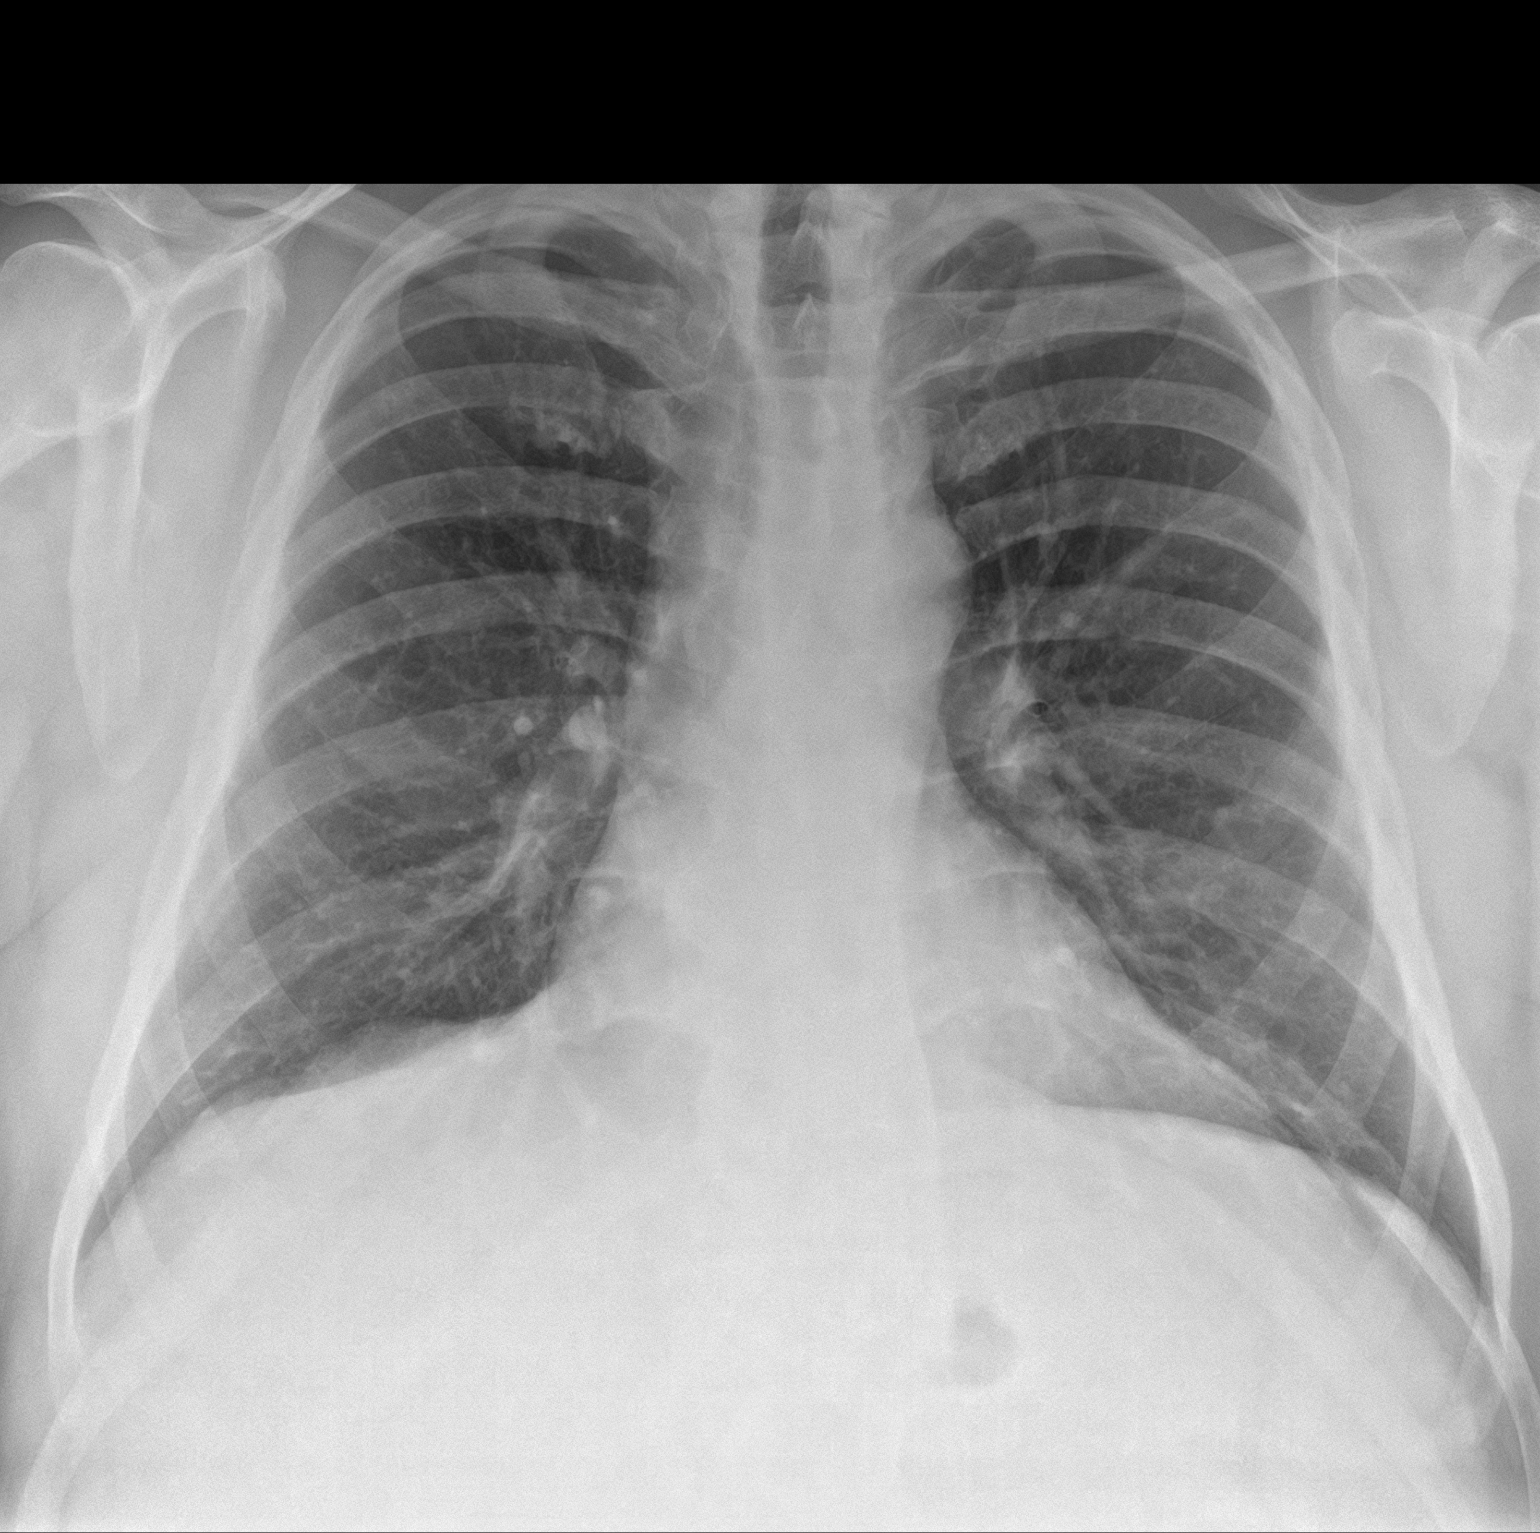

[chest lat]
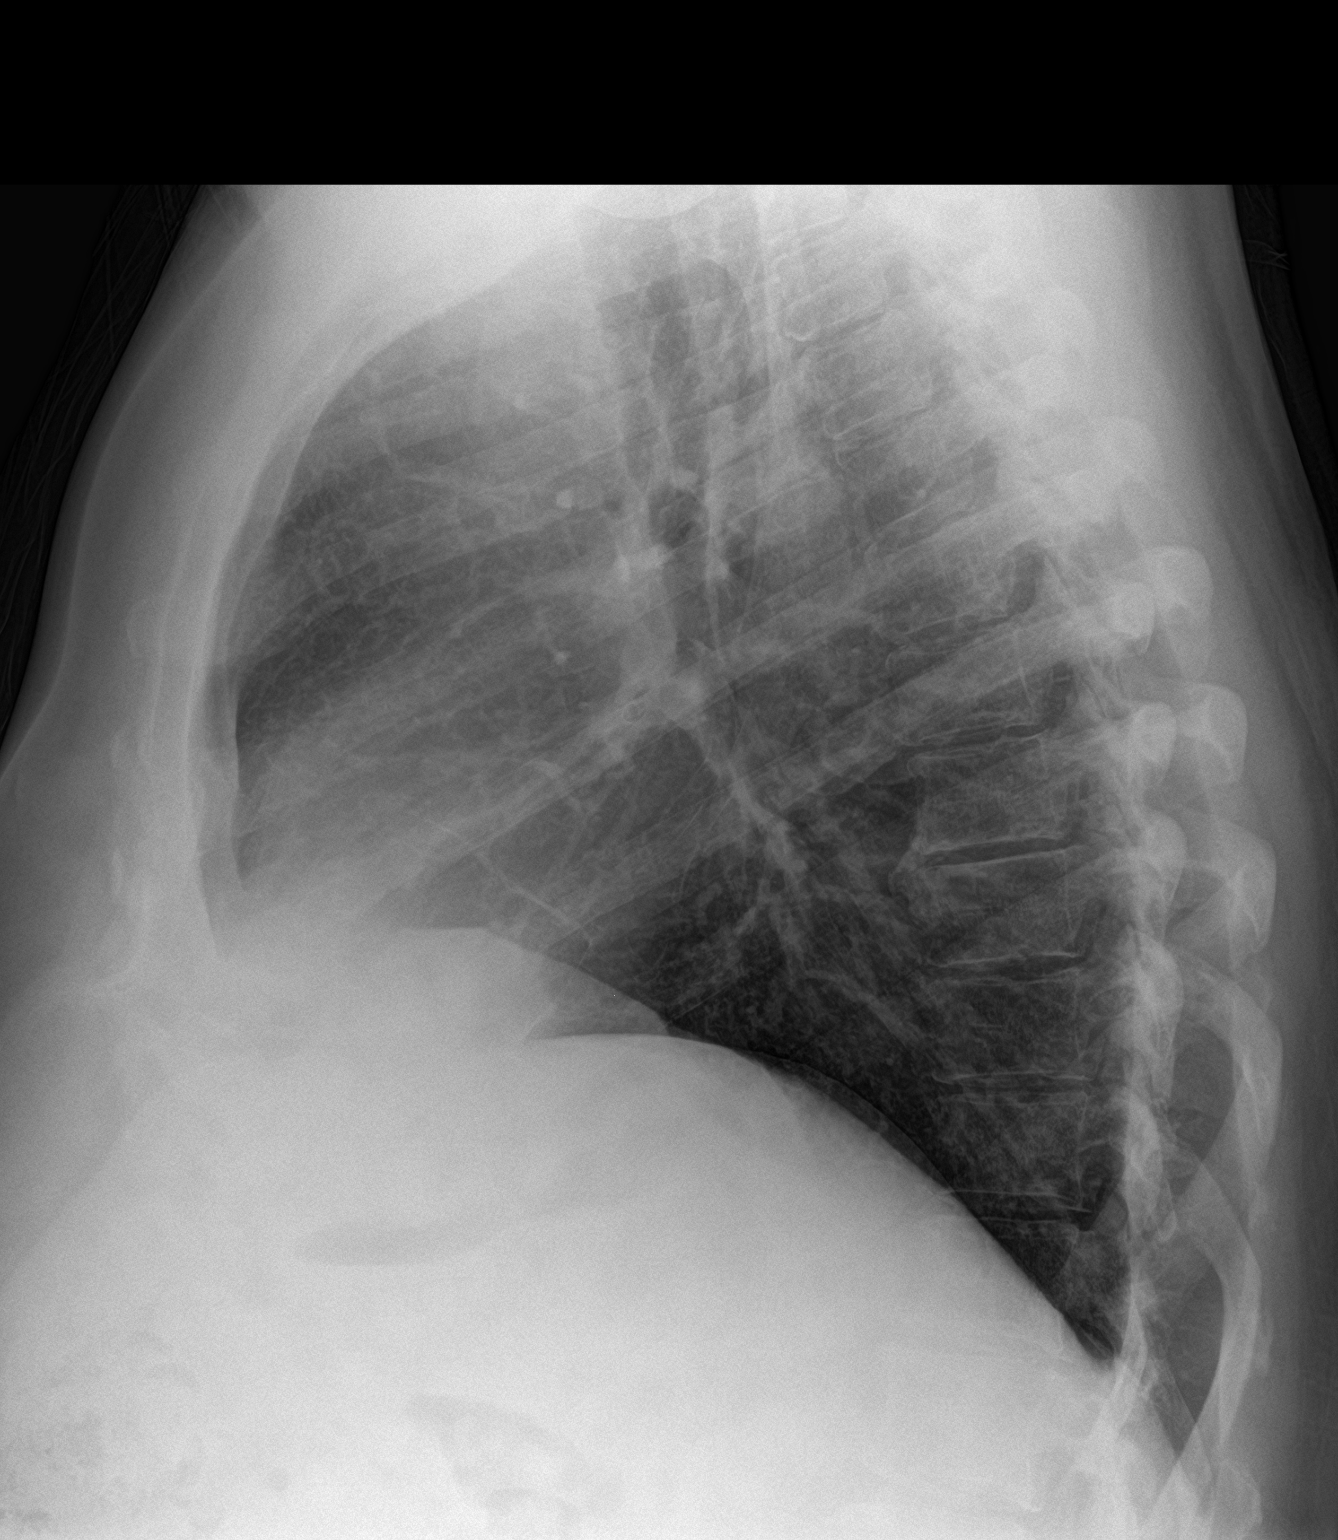

[2 of 2 positions shown; findings below may reference images not displayed]

FINDINGS: Heart size and mediastinal contours are within normal limits. No
radiodense foreign body is seen within the mediastinum. Lungs are
clear. No pleural effusion or pneumothorax is seen. Osseous
structures about the chest are unremarkable.
IMPRESSION: Negative chest x-ray. No radiodense foreign body seen within the
mediastinum.

## 2020-10-25 ENCOUNTER — Emergency Department
Admission: EM | Admit: 2020-10-25 | Discharge: 2020-10-25 | Disposition: A | Discharge status: Home / Self Care | Payer: Medicare Other | Attending: Emergency Medicine | Admitting: Emergency Medicine

## 2020-10-25 ENCOUNTER — Other Ambulatory Visit: Payer: Self-pay

## 2020-10-25 ENCOUNTER — Encounter: Payer: Self-pay | Admitting: Emergency Medicine

## 2020-10-25 DIAGNOSIS — Z79899 Other long term (current) drug therapy: Secondary | ICD-10-CM | POA: Diagnosis not present

## 2020-10-25 DIAGNOSIS — R1310 Dysphagia, unspecified: Secondary | ICD-10-CM | POA: Diagnosis present

## 2020-10-25 DIAGNOSIS — Z20822 Contact with and (suspected) exposure to covid-19: Secondary | ICD-10-CM | POA: Insufficient documentation

## 2020-10-25 DIAGNOSIS — Z7984 Long term (current) use of oral hypoglycemic drugs: Secondary | ICD-10-CM | POA: Insufficient documentation

## 2020-10-25 DIAGNOSIS — K222 Esophageal obstruction: Secondary | ICD-10-CM

## 2020-10-25 DIAGNOSIS — T18128A Food in esophagus causing other injury, initial encounter: Secondary | ICD-10-CM | POA: Diagnosis not present

## 2020-10-25 DIAGNOSIS — E119 Type 2 diabetes mellitus without complications: Secondary | ICD-10-CM | POA: Insufficient documentation

## 2020-10-25 DIAGNOSIS — X58XXXA Exposure to other specified factors, initial encounter: Secondary | ICD-10-CM | POA: Insufficient documentation

## 2020-10-25 DIAGNOSIS — I10 Essential (primary) hypertension: Secondary | ICD-10-CM | POA: Diagnosis not present

## 2020-10-25 LAB — RESP PANEL BY RT-PCR (FLU A&B, COVID) ARPGX2
Influenza A by PCR: NEGATIVE
Influenza B by PCR: NEGATIVE
SARS Coronavirus 2 by RT PCR: NEGATIVE

## 2020-10-25 MED ORDER — OMEPRAZOLE MAGNESIUM 20 MG PO TBEC: 20.0000 mg | DELAYED_RELEASE_TABLET | Freq: Every day | ORAL | 1 refills | Status: DC

## 2020-10-25 MED ORDER — GLUCAGON HCL (RDNA) 1 MG IJ SOLR
1.0000 mg | Freq: Once | INTRAMUSCULAR | Status: AC
  Administered 2020-10-25: 1 mg via INTRAVENOUS
  Filled 2020-10-25 (×2): qty 1

## 2020-10-25 MED ORDER — PANTOPRAZOLE SODIUM 40 MG PO TBEC: 40.0000 mg | DELAYED_RELEASE_TABLET | Freq: Every day | ORAL | 0 refills | Status: DC

## 2020-10-25 NOTE — ED Triage Notes (Signed)
States was eating peanut butter and an apple and a piece became stuck in esophagus.  States occurred about 20 minutes PTA.  Voice clear and strong.  Unable to pass saliva or liquid through esophagus.

## 2020-10-25 NOTE — ED Notes (Signed)
Pt now able to tolerate PO intake without vomiting. Pt states that he does not feel FB in esophagus at this time.

## 2020-10-25 NOTE — ED Provider Notes (Signed)
[facility] Emergency Department Provider Note   ____________________________________________   Event Date/Time   First MD Initiated Contact with Patient 10/25/20 1326     (approximate)  I have reviewed the triage vital signs and the nursing notes.   HISTORY  Chief Complaint Swallowed Foreign Body    HPI Joel Norman is a 55 y.o. male with past medical history of hypertension, hyperlipidemia, and diabetes who presents to the ED complaining of difficulty swallowing.  Patient reports approximately 30 minutes prior to arrival he was eating an apple with peanut butter and felt to get stuck in the middle of his throat.  He has had discomfort in his throat since then and has been unable to swallow his spit.  He cannot eat or drink anything since this episode and describes it as similar to when he had a piece of steak stuck in his throat about 1 year ago.  Symptoms at that time were alleviated by dose of glucagon.  He was told at that time he needs to schedule follow-up with GI, but he states that Covid gotten away.  He reports a history of reflux but denies any other issues with his esophagus.        Past Medical History:  Diagnosis Date  . Anxiety   . Arachnoid cyst    causes headaches  . Diabetes mellitus without complication (HCC)   . ED (erectile dysfunction)   . Elevated cholesterol   . Hypertension   . Microalbuminuria   . Migraines     Patient Active Problem List   Diagnosis Date Noted  . Elevated cholesterol   . Hypertension   . Diabetes mellitus without complication (HCC)   . Anxiety   . Arachnoid cyst   . ED (erectile dysfunction)   . Migraines     Past Surgical History:  Procedure Laterality Date  . 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 FUNCTIONAL ENDOSCOPIC SINUS SURGERY      Prior to Admission medications   Medication Sig Start Date End Date Taking? Authorizing Provider  omeprazole (PRILOSEC OTC) 20 MG tablet Take 1 tablet (20 mg  total) by mouth daily. 10/25/20 12/20/20 Yes Chesley Noon, MD  acetaminophen-codeine (TYLENOL #3) 300-30 MG per tablet Take 1 tablet by mouth every 6 (six) hours as needed for moderate pain. 02/26/15   Menshew, Charlesetta Ivory, PA-C  Alum & Mag Hydroxide-Simeth (MAGIC MOUTHWASH W/LIDOCAINE) SOLN Take 10 mLs by mouth 3 (three) times daily as needed for mouth pain. 02/26/15   Menshew, Charlesetta Ivory, PA-C  atenolol (TENORMIN) 25 MG tablet Take by mouth daily.    [provider]  cephALEXin (KEFLEX) 500 MG capsule Take 1 capsule (500 mg total) by mouth 3 (three) times daily. 06/07/16   Sharman Cheek, MD  furosemide (LASIX) 40 MG tablet Take 40 mg by mouth.    [provider]  glipiZIDE (GLUCOTROL) 10 MG tablet Take 10 mg by mouth daily before breakfast.    [provider]  lisinopril (PRINIVIL,ZESTRIL) 10 MG tablet Take 10 mg by mouth 2 (two) times daily.    [provider]  metFORMIN (GLUCOPHAGE) 1000 MG tablet Take 1,000 mg by mouth 2 (two) times daily with a meal.    [provider]  methocarbamol (ROBAXIN) 750 MG tablet Take 1 tablet (750 mg total) by mouth 4 (four) times daily. 05/13/17   Faythe Ghee, PA-C  naproxen (NAPROSYN) 500 MG tablet Take 1 tablet (500 mg total) by mouth  2 (two) times daily with a meal. 06/07/16   Sharman Cheek, MD  ondansetron (ZOFRAN ODT) 4 MG disintegrating tablet Take 1 tablet (4 mg total) by mouth every 8 (eight) hours as needed for nausea or vomiting. 10/31/18   Irean Hong, MD  oxyCODONE-acetaminophen (ROXICET) 5-325 MG per tablet Take 1 tablet by mouth every 4 (four) hours as needed for severe pain. 03/03/15   Phineas Semen, MD  sertraline (ZOLOFT) 100 MG tablet Take 100 mg by mouth daily.    [provider]  sulfamethoxazole-trimethoprim (BACTRIM DS) 800-160 MG tablet Take 1 tablet by mouth 2 (two) times daily. 06/07/16   Sharman Cheek, MD    Allergies Clarithromycin, Codeine, Influenza vaccines,  Pioglitazone, Escitalopram oxalate, and Penicillins  Family History  Problem Relation Age of Onset  . Diabetes Mother   . Hypertension Mother   . Asthma Mother   . Diabetes Father   . Asthma Father   . Hypertension Father   . Cancer Father        brain  . CAD Father     Social History Social History   Tobacco Use  . Smoking status: Never Smoker  . Smokeless tobacco: Never Used  Substance Use Topics  . Alcohol use: No    Review of Systems  Constitutional: No fever/chills Eyes: No visual changes. ENT: No sore throat.  Positive for impacted food bolus. Cardiovascular: Denies chest pain. Respiratory: Denies shortness of breath. Gastrointestinal: No abdominal pain.  No nausea, no vomiting.  No diarrhea.  No constipation. Genitourinary: Negative for dysuria. Musculoskeletal: Negative for back pain. Skin: Negative for rash. Neurological: Negative for headaches, focal weakness or numbness.  ____________________________________________   PHYSICAL EXAM:  VITAL SIGNS: ED Triage Vitals  Enc Vitals Group     BP 10/25/20 1323 (!) 118/98     Pulse Rate 10/25/20 1323 95     Resp 10/25/20 1323 16     Temp 10/25/20 1323 98.4 F (36.9 C)     Temp Source 10/25/20 1323 Oral     SpO2 10/25/20 1323 99 %     Weight 10/25/20 1321 229 lb 15 oz (104.3 kg)     Height 10/25/20 1321 6' (1.829 m)     Head Circumference --      Peak Flow --      Pain Score 10/25/20 1321 0     Pain Loc --      Pain Edu? --      Excl. in GC? --     Constitutional: Alert and oriented. Eyes: Conjunctivae are normal. Head: Atraumatic. Nose: No congestion/rhinnorhea. Mouth/Throat: Mucous membranes are moist.  Oropharynx clear, continuously spitting oral secretions. Neck: Normal ROM Cardiovascular: Normal rate, regular rhythm. Grossly normal heart sounds. Respiratory: Normal respiratory effort.  No retractions. Lungs CTAB. Gastrointestinal: Soft and nontender. No distention. Genitourinary:  deferred Musculoskeletal: No lower extremity tenderness nor edema. Neurologic:  Normal speech and language. No gross focal neurologic deficits are appreciated. Skin:  Skin is warm, dry and intact. No rash noted. Psychiatric: Mood and affect are normal. Speech and behavior are normal.  ____________________________________________   LABS (all labs ordered are listed, but only abnormal results are displayed)  Labs Reviewed  RESP PANEL BY RT-PCR (FLU A&B, COVID) [id number]     PROCEDURES  Procedure(s) performed (including Critical Care):  Procedures   ____________________________________________   INITIAL IMPRESSION / ASSESSMENT AND PLAN / ED COURSE       55 year old male with past medical history of hypertension, hyperlipidemia, diabetes presents  to the ED with feeling of food stuck in his throat after eating peanut butter with apple.  He has been continuously spitting oral secretions since onset of symptoms and likely has impacted food bolus.  We will treat with IV glucagon as he has previously responded to this, but if this is unsuccessful we will discuss with GI for endoscopy.  Patient is feeling slightly better following dose of glucagon, now seems to be tolerating his secretions and is taking small sips of water.  If he continues to be able to eat and drink normally, he would be appropriate for discharge home with outpatient GI follow-up, may be prescribed omeprazole in this scenario.  If he has worsening symptoms, would need to discuss with GI.  Patient turned over to oncoming provider pending reassessment.      ____________________________________________   FINAL CLINICAL IMPRESSION(S) / ED DIAGNOSES  Final diagnoses:  Esophageal obstruction due to food impaction     ED Discharge Orders         Ordered    omeprazole (PRILOSEC OTC) 20 MG tablet  Daily        10/25/20 1545           Note:  This document was prepared using Dragon voice recognition software and  may include unintentional dictation errors.   Chesley Noon, MD 10/25/20 [phone]

## 2020-10-25 NOTE — ED Provider Notes (Signed)
Patient feels much better after Glucagon. Tolerating PO. Will d/c with outpt GI follow-up.   Shaune Pollack, MD 10/25/20 [phone]

## 2020-10-31 ENCOUNTER — Other Ambulatory Visit: Payer: Self-pay

## 2020-10-31 ENCOUNTER — Emergency Department
Admission: EM | Admit: 2020-10-31 | Discharge: 2020-10-31 | Disposition: A | Discharge status: Home / Self Care | Payer: Medicare Other | Attending: Emergency Medicine | Admitting: Emergency Medicine

## 2020-10-31 DIAGNOSIS — R1310 Dysphagia, unspecified: Secondary | ICD-10-CM | POA: Insufficient documentation

## 2020-10-31 DIAGNOSIS — E7800 Pure hypercholesterolemia, unspecified: Secondary | ICD-10-CM | POA: Insufficient documentation

## 2020-10-31 DIAGNOSIS — X58XXXA Exposure to other specified factors, initial encounter: Secondary | ICD-10-CM | POA: Insufficient documentation

## 2020-10-31 DIAGNOSIS — E1129 Type 2 diabetes mellitus with other diabetic kidney complication: Secondary | ICD-10-CM | POA: Insufficient documentation

## 2020-10-31 DIAGNOSIS — E1169 Type 2 diabetes mellitus with other specified complication: Secondary | ICD-10-CM | POA: Insufficient documentation

## 2020-10-31 DIAGNOSIS — Z79899 Other long term (current) drug therapy: Secondary | ICD-10-CM | POA: Diagnosis not present

## 2020-10-31 DIAGNOSIS — I10 Essential (primary) hypertension: Secondary | ICD-10-CM | POA: Insufficient documentation

## 2020-10-31 DIAGNOSIS — T18108A Unspecified foreign body in esophagus causing other injury, initial encounter: Secondary | ICD-10-CM | POA: Insufficient documentation

## 2020-10-31 DIAGNOSIS — Z7984 Long term (current) use of oral hypoglycemic drugs: Secondary | ICD-10-CM | POA: Insufficient documentation

## 2020-10-31 NOTE — ED Triage Notes (Signed)
Pt states that he was eating chicken approx 25 ago and now it's stuck in his throat, reports unable to swallow saliva, pt spitting into bag  Pt reports hx of same

## 2020-10-31 NOTE — ED Provider Notes (Signed)
[facility] Emergency Department Provider Note   ____________________________________________   Event Date/Time   First MD Initiated Contact with Patient 10/31/20 2145     (approximate)  I have reviewed the triage vital signs and the nursing notes.   HISTORY  Chief Complaint Problem swallowing    HPI Joel Norman is a 55 y.o. male about 30 minutes before he came to the ER patient reports that he was eating chicken meat without bones in it, and 1 piece got lodged in his throat.  He was unable to swallow saliva, with spitting up into a bag.  However he reports that while he was waiting to be seen he felt it pass and all of his symptoms are resolved.  He has been able to drink about two thirds of a bottle of water at this point and his symptoms are gone no pain or discomfort.  No vomiting.  No bloody emesis.  Reports this is actually happened a few times now he has had to come to the ER and was actually supposed to have an endoscopy scheduled soon, and he reports that the clinic is supposed to contact him tomorrow for same.  He has been told he probably has a narrowing in fact 10 years ago he saw ENT and he reports he was told he had a suspiciously narrow esophagus but did not pursue that at that time.  He is able to eat and drink most foods but the first time this happened he was eating steak this time eating a piece of chicken.  He plans to follow-up with GI, reports he feels well now would like to be discharged.  He plans also to not eat heavy meats or large pieces of food moving forward until he is had assessment with GI  No chest pain.  No nausea.  No vomiting.  No fever no trouble breathing feels perfectly normal and fine now  Past Medical History:  Diagnosis Date  . Anxiety   . Arachnoid cyst    causes headaches  . Diabetes mellitus without complication (HCC)   . ED (erectile dysfunction)   . Elevated cholesterol   . Hypertension   .  Microalbuminuria   . Migraines     Patient Active Problem List   Diagnosis Date Noted  . Elevated cholesterol   . Hypertension   . Diabetes mellitus without complication (HCC)   . Anxiety   . Arachnoid cyst   . ED (erectile dysfunction)   . Migraines     Past Surgical History:  Procedure Laterality Date  . 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 FUNCTIONAL ENDOSCOPIC SINUS SURGERY      Prior to Admission medications   Medication Sig Start Date End Date Taking? Authorizing Provider  acetaminophen-codeine (TYLENOL #3) 300-30 MG per tablet Take 1 tablet by mouth every 6 (six) hours as needed for moderate pain. 02/26/15   Menshew, Charlesetta Ivory, PA-C  Alum & Mag Hydroxide-Simeth (MAGIC MOUTHWASH W/LIDOCAINE) SOLN Take 10 mLs by mouth 3 (three) times daily as needed for mouth pain. 02/26/15   Menshew, Charlesetta Ivory, PA-C  atenolol (TENORMIN) 25 MG tablet Take by mouth daily.    [provider]  cephALEXin (KEFLEX) 500 MG capsule Take 1 capsule (500 mg total) by mouth 3 (three) times daily. 06/07/16   Sharman Cheek, MD  furosemide (LASIX) 40 MG tablet Take 40 mg by mouth.    [provider]  glipiZIDE (GLUCOTROL) 10 MG tablet Take  10 mg by mouth daily before breakfast.    [provider]  lisinopril (PRINIVIL,ZESTRIL) 10 MG tablet Take 10 mg by mouth 2 (two) times daily.    [provider]  metFORMIN (GLUCOPHAGE) 1000 MG tablet Take 1,000 mg by mouth 2 (two) times daily with a meal.    [provider]  methocarbamol (ROBAXIN) 750 MG tablet Take 1 tablet (750 mg total) by mouth 4 (four) times daily. 05/13/17   Fisher, Roselyn Bering, PA-C  naproxen (NAPROSYN) 500 MG tablet Take 1 tablet (500 mg total) by mouth 2 (two) times daily with a meal. 06/07/16   Sharman Cheek, MD  ondansetron (ZOFRAN ODT) 4 MG disintegrating tablet Take 1 tablet (4 mg total) by mouth every 8 (eight) hours as needed for nausea or vomiting. 10/31/18   Irean Hong, MD   oxyCODONE-acetaminophen (ROXICET) 5-325 MG per tablet Take 1 tablet by mouth every 4 (four) hours as needed for severe pain. 03/03/15   Phineas Semen, MD  pantoprazole (PROTONIX) 40 MG tablet Take 1 tablet (40 mg total) by mouth daily. 10/25/20 11/24/20  Shaune Pollack, MD  sertraline (ZOLOFT) 100 MG tablet Take 100 mg by mouth daily.    [provider]  sulfamethoxazole-trimethoprim (BACTRIM DS) 800-160 MG tablet Take 1 tablet by mouth 2 (two) times daily. 06/07/16   Sharman Cheek, MD  omeprazole (PRILOSEC OTC) 20 MG tablet Take 1 tablet (20 mg total) by mouth daily. 10/25/20 10/25/20  Chesley Noon, MD    Allergies Clarithromycin, Codeine, Influenza vaccines, Pioglitazone, Escitalopram oxalate, and Penicillins  Family History  Problem Relation Age of Onset  . Diabetes Mother   . Hypertension Mother   . Asthma Mother   . Diabetes Father   . Asthma Father   . Hypertension Father   . Cancer Father        brain  . CAD Father     Social History Social History   Tobacco Use  . Smoking status: Never Smoker  . Smokeless tobacco: Never Used  Substance Use Topics  . Alcohol use: No    Review of Systems Constitutional: No fever/chills ENT: No sore throat. Cardiovascular: Denies chest pain. Respiratory: Denies shortness of breath. Gastrointestinal: No abdominal pain.   Skin: Negative for rash or swelling. Neurological: Negative for headaches.    ____________________________________________   PHYSICAL EXAM:  VITAL SIGNS: ED Triage Vitals  Enc Vitals Group     BP 10/31/20 1956 (!) 140/94     Pulse Rate 10/31/20 1956 99     Resp 10/31/20 1956 (!) 22     Temp 10/31/20 1956 98.8 F (37.1 C)     Temp Source 10/31/20 1956 Oral     SpO2 10/31/20 1954 99 %     Weight 10/31/20 1957 260 lb (117.9 kg)     Height 10/31/20 1957 6' (1.829 m)     Head Circumference --      Peak Flow --      Pain Score 10/31/20 1956 6     Pain Loc --      Pain Edu? --      Excl.  in GC? --     Constitutional: Alert and oriented. Well appearing and in no acute distress. Eyes: Conjunctivae are normal. Head: Atraumatic. Nose: No congestion/rhinnorhea. Mouth/Throat: Mucous membranes are moist. Neck: No stridor.  Oropharynx is widely patent.  His anterior neck appears normal without noted soft tissue or other swelling or edema.  No masses noted.  Speaks in very full clear  sentences.  Swallowing secretions without difficulty drinking a water bottle Cardiovascular: Normal rate, regular rhythm. Grossly normal heart sounds.  Good peripheral circulation. Respiratory: Normal respiratory effort.  No retractions. Lungs CTAB. Gastrointestinal: Soft and nontender. No distention. Musculoskeletal: No lower extremity tenderness nor edema. Neurologic:  Normal speech and language. No gross focal neurologic deficits are appreciated.  Skin:  Skin is warm, dry and intact. No rash noted. Psychiatric: Mood and affect are normal. Speech and behavior are normal.  ____________________________________________   LABS (all labs ordered are listed, but only abnormal results are displayed)  Labs Reviewed - No data to display ____________________________________________  EKG   ____________________________________________  RADIOLOGY   ____________________________________________   PROCEDURES  Procedure(s) performed: None  Procedures  Critical Care performed: No  ____________________________________________   INITIAL IMPRESSION / ASSESSMENT AND PLAN / ED COURSE  Pertinent labs & imaging results that were available during my care of the patient were reviewed by me and considered in my medical decision making (see chart for details).   Review of records and discussion with the patient seen consistent with some type of likely esophageal stricture or intermittent cause for blockage of any esophagus.  He now shows no signs or symptoms of ongoing food bolus impaction.  Suspect he  had a impaction that is now self relieved.  I think his plan to follow-up with GI including a plan to discuss with him tomorrow is very reasonable.  He is already engaged a GI physician and reports he was supposed to hear from scheduling yesterday or on Friday, but will call them back tomorrow.  Reassuring exam normal vital signs.  No distress complete resolution of symptoms.  No cardiac or pulmonary symptoms.    Return precautions and treatment recommendations and follow-up discussed with the patient who is agreeable with the plan.       ____________________________________________   FINAL CLINICAL IMPRESSION(S) / ED DIAGNOSES  Final diagnoses:  Esophageal foreign body, initial encounter        Note:  This document was prepared using Dragon voice recognition software and may include unintentional dictation errors       Sharyn Creamer, MD 11/01/20 1112

## 2020-10-31 NOTE — ED Notes (Signed)
Pt's wife at desk reports pt hx of anxiety

## 2020-10-31 NOTE — Discharge Instructions (Signed)
Please call your gastroenterologist tomorrow to set up you are future appointment with them.  I am concerned you have a narrowing of your esophagus that needs further evaluation with their team.  Please return to the emergency room right away if you are to develop a fever, severe nausea, your pain becomes severe or worsens, you are unable to keep food down, begin vomiting any dark or bloody fluid, you develop any dark or bloody stools, feel dehydrated, or other new concerns or symptoms arise.

## 2020-10-31 NOTE — ED Notes (Signed)
Pt with frequently bringing up sputum, pt able to speak in full sentences, Pt states it feels uncomfortable in his throat.

## 2021-10-21 ENCOUNTER — Emergency Department
Admission: EM | Admit: 2021-10-21 | Discharge: 2021-10-21 | Disposition: A | Discharge status: Home / Self Care | Payer: Medicare Other | Attending: Emergency Medicine | Admitting: Emergency Medicine

## 2021-10-21 ENCOUNTER — Other Ambulatory Visit: Payer: Self-pay

## 2021-10-21 ENCOUNTER — Encounter: Payer: Self-pay | Admitting: Emergency Medicine

## 2021-10-21 DIAGNOSIS — E119 Type 2 diabetes mellitus without complications: Secondary | ICD-10-CM | POA: Diagnosis not present

## 2021-10-21 DIAGNOSIS — B029 Zoster without complications: Secondary | ICD-10-CM | POA: Diagnosis not present

## 2021-10-21 DIAGNOSIS — R21 Rash and other nonspecific skin eruption: Secondary | ICD-10-CM | POA: Diagnosis present

## 2021-10-21 MED ORDER — VALACYCLOVIR HCL 500 MG PO TABS
1000.0000 mg | ORAL_TABLET | ORAL | Status: AC
  Administered 2021-10-21: 1000 mg via ORAL
  Filled 2021-10-21: qty 2

## 2021-10-21 MED ORDER — IBUPROFEN 600 MG PO TABS
600.0000 mg | ORAL_TABLET | Freq: Once | ORAL | Status: AC
  Administered 2021-10-21: 600 mg via ORAL
  Filled 2021-10-21: qty 1

## 2021-10-21 MED ORDER — OXYCODONE-ACETAMINOPHEN 5-325 MG PO TABS: 2.0000 | ORAL_TABLET | Freq: Three times a day (TID) | ORAL | 0 refills | Status: AC | PRN

## 2021-10-21 MED ORDER — VALACYCLOVIR HCL 1 G PO TABS: 1000.0000 mg | ORAL_TABLET | Freq: Three times a day (TID) | ORAL | 0 refills | Status: AC

## 2021-10-21 MED ORDER — LIDOCAINE 5 % EX PTCH: 1.0000 | MEDICATED_PATCH | Freq: Two times a day (BID) | CUTANEOUS | 0 refills | Status: AC

## 2021-10-21 MED ORDER — OXYCODONE-ACETAMINOPHEN 5-325 MG PO TABS
2.0000 | ORAL_TABLET | Freq: Once | ORAL | Status: AC
  Administered 2021-10-21: 2 via ORAL
  Filled 2021-10-21: qty 2

## 2021-10-21 NOTE — ED Triage Notes (Signed)
Patient ambulatory to triage with steady gait, without difficulty or distress noted; pt reports painful burning rash/lesions to left side trunk x 3-4 days ?

## 2021-10-21 NOTE — Discharge Instructions (Signed)
Please read through the included information about shingles.  Take the full course of prescribed antiviral medicine (valacyclovir) which is taken 3 times a day for 7 days.  Drink plenty of fluids.  We recommend you take ibuprofen 600 mg 3 times a day with meals. Take Percocet as prescribed for severe pain. Do not drink alcohol, drive or participate in any other potentially dangerous activities while taking this medication as it may make you sleepy. Do not take this medication with any other sedating medications, either prescription or over-the-counter. If you were prescribed Percocet or Vicodin, do not take these with acetaminophen (Tylenol) as it is already contained within these medications. ?  ?This medication is an opiate (or narcotic) pain medication and can be habit forming.  Use it as little as possible to achieve adequate pain control.  Do not use or use it with extreme caution if you have a history of opiate abuse or dependence.  If you are on a pain contract with your primary care doctor or a pain specialist, be sure to let them know you were prescribed this medication today from the [facility] Emergency Department.  This medication is intended for your use only - do not give any to anyone else and keep it in a secure place where nobody else, especially children, have access to it.  It will also cause or worsen constipation, so you may want to consider taking an over-the-counter stool softener while you are taking this medication. ? ? ?Follow-up with your regular doctor when possible, and return to the emergency department if you develop new or worsening symptoms that concern you. ?

## 2021-10-21 NOTE — ED Provider Notes (Signed)
? ?[facility] ?Provider Note ? ? ? Event Date/Time  ? First MD Initiated Contact with Patient 10/21/21 [phone]   ?  (approximate) ? ? ?History  ? ?Rash ? ? ?HPI ? ?Joel Norman is a 56 y.o. male with history of insulin-dependent diabetes and who presents for evaluation of a painful rash to the left side of his chest and back.  He reports that the pain started nearly a week ago and was so painful that he cannot lie on his left side and thus has been getting very little sleep.  Within the last 1 to 2 days his wife noticed there was some redness and then some blisterlike lesions that started on the left side of his back and continue around beneath his left arm and onto the left front part of his chest.  He has no lesions anywhere else and they do not cross the midline of his anterior torso or his back.  No facial or lesions around his eyes, no ear pain, no fever, no nausea or vomiting.  He is very tired because of the painful and burning lesions keeping him from being able to sleep but otherwise he has no complaints or concerns. ?  ? ? ?Physical Exam  ? ?Triage Vital Signs: ?ED Triage Vitals  ?Enc Vitals Group  ?   BP 10/21/21 0413 (!) 169/96  ?   Pulse Rate 10/21/21 0413 86  ?   Resp 10/21/21 0413 17  ?   Temp 10/21/21 0413 98.2 ?F (36.8 ?C)  ?   Temp src --   ?   SpO2 10/21/21 0413 97 %  ?   Weight 10/21/21 0417 112.5 kg (248 lb)  ?   Height 10/21/21 0417 1.829 m (6')  ?   Head Circumference --   ?   Peak Flow --   ?   Pain Score 10/21/21 0417 8  ?   Pain Loc --   ?   Pain Edu? --   ?   Excl. in GC? --   ? ? ?Most recent vital signs: ?Vitals:  ? 10/21/21 0413  ?BP: (!) 169/96  ?Pulse: 86  ?Resp: 17  ?Temp: 98.2 ?F (36.8 ?C)  ?SpO2: 97%  ? ? ? ?General: Awake, no distress though he appears uncomfortable. ?CV:  Good peripheral perfusion.  ?Resp:  Normal effort.  ?Abd:  No distention.  ?Other:  No facial rash, no conjunctival injection or lesions around his eyes.  No ear  involvement. ?Skin:  Patient has somewhat patchy erythema with erythematous vesicular lesions that extend from the left side of the middle of his back in a patchy distribution around to the side and front of his body.  Nothing extends past the midline and is consistent with a relatively mild herpes zoster eruption. ? ? ?ED Results / Procedures / Treatments  ? ?Labs ?(all labs ordered are listed, but only abnormal results are displayed) ?Labs Reviewed - No data to display ? ? ? ?MEDICATIONS ORDERED IN ED: ?Medications  ?valACYclovir (VALTREX) tablet 1,000 mg (1,000 mg Oral Given 10/21/21 0445)  ?oxyCODONE-acetaminophen (PERCOCET/ROXICET) 5-325 MG per tablet 2 tablet (2 tablets Oral Given 10/21/21 0445)  ?ibuprofen (ADVIL) tablet 600 mg (600 mg Oral Given 10/21/21 0445)  ? ? ? ?IMPRESSION / MDM / ASSESSMENT AND PLAN / ED COURSE  ?I reviewed the triage vital signs and the nursing notes. ?             ?               ? ?  Differential diagnosis includes, but is not limited to, herpes zoster, cellulitis, folliculitis. ? ?Patient is generally well-appearing in spite of his obvious fatigue after not being able to sleep well for days.  The history and physical exam are very consistent and suggestive of shingles/herpes zoster.  There is no indication of disseminated herpes zoster and the patient is not immunocompromised (other than being an insulin-dependent diabetic).  His vital signs are stable and within normal limits other than some hypertension.  No ophthalmic or otic involvement. ? ?Patient's rash started within 72 hours even if the allodynia started earlier.  I discussed the diagnosis with the patient and am starting him on valacyclovir 1000 mg p.o. twice daily x7 days.  Also providing Lidoderm and Percocet and encouraged the use of ibuprofen with meals.  I given the first dose of valacyclovir, Percocet, and ibuprofen in the emergency department.  I encouraged him to follow-up with his outpatient doctor.  Patient and his  wife understand and agree. ? ?No indication for admission or further evaluation or testing at this time. ? ? ? ? ?  ? ? ?FINAL CLINICAL IMPRESSION(S) / ED DIAGNOSES  ? ?Final diagnoses:  ?Herpes zoster without complication  ? ? ? ?Rx / DC Orders  ? ?ED Discharge Orders   ? ?      Ordered  ?  oxyCODONE-acetaminophen (PERCOCET) 5-325 MG tablet  Every 8 hours PRN       ? 10/21/21 0443  ?  valACYclovir (VALTREX) 1000 MG tablet  3 times daily       ? 10/21/21 0443  ?  lidocaine (LIDODERM) 5 %  Every 12 hours       ? 10/21/21 0443  ? ?  ?  ? ?  ? ? ? ?Note:  This document was prepared using Dragon voice recognition software and may include unintentional dictation errors. ?  ?Loleta Rose, MD ?10/21/21 [phone] ? ?

## 2022-06-11 ENCOUNTER — Emergency Department
Admission: EM | Admit: 2022-06-11 | Discharge: 2022-06-11 | Disposition: A | Discharge status: Home / Self Care | Payer: Medicare Other | Attending: Emergency Medicine | Admitting: Emergency Medicine

## 2022-06-11 ENCOUNTER — Other Ambulatory Visit: Payer: Self-pay

## 2022-06-11 DIAGNOSIS — E119 Type 2 diabetes mellitus without complications: Secondary | ICD-10-CM | POA: Diagnosis not present

## 2022-06-11 DIAGNOSIS — X58XXXA Exposure to other specified factors, initial encounter: Secondary | ICD-10-CM | POA: Diagnosis not present

## 2022-06-11 DIAGNOSIS — T18128A Food in esophagus causing other injury, initial encounter: Secondary | ICD-10-CM | POA: Diagnosis not present

## 2022-06-11 DIAGNOSIS — I10 Essential (primary) hypertension: Secondary | ICD-10-CM | POA: Insufficient documentation

## 2022-06-11 DIAGNOSIS — W44F3XA Food entering into or through a natural orifice, initial encounter: Secondary | ICD-10-CM

## 2022-06-11 MED ORDER — PANTOPRAZOLE SODIUM 40 MG PO TBEC: 40.0000 mg | DELAYED_RELEASE_TABLET | Freq: Every day | ORAL | 0 refills | Status: AC

## 2022-06-11 MED ORDER — ALUM & MAG HYDROXIDE-SIMETH 200-200-20 MG/5ML PO SUSP
30.0000 mL | Freq: Once | ORAL | Status: AC
  Administered 2022-06-11: 30 mL via ORAL
  Filled 2022-06-11: qty 30

## 2022-06-11 NOTE — ED Notes (Signed)
ED Provider at bedside. 

## 2022-06-11 NOTE — ED Notes (Signed)
Pt. Given room temp shasta.

## 2022-06-11 NOTE — ED Notes (Signed)
Pt. To nurses station, states he threw up his food bolus, and was able to swallow the rest down. Pt. States he can tell he no longer has food bolus, can swallow easily. MD notified.

## 2022-06-11 NOTE — ED Triage Notes (Signed)
Pt complaining of something stuck in throat. Happened this morning while eating breakfast.

## 2022-06-11 NOTE — ED Provider Notes (Signed)
[facility] Provider Note    Event Date/Time   First MD Initiated Contact with Patient 06/11/22 1105     (approximate)   History   No chief complaint on file.   HPI  Joel Norman is a 56 y.o. male  here with food impaction. Pt has a h/o esophageal impaction and narrow esophagus, states he was told he may have to have it stretched at some point. Was in a hurry this AM and eating sausage and eggs when he felt a bite get stuck. He tried to swallow water and belch without relief so he presents for evaluation. He has had nausea/vomiting of spit but no food. No blood. No recent dysphagia or difficulty swallowing. Last EGD was "a year or so" ago.       Physical Exam   Triage Vital Signs: ED Triage Vitals  Enc Vitals Group     BP 06/11/22 1103 (!) 171/90     Pulse Rate 06/11/22 1103 78     Resp 06/11/22 1103 16     Temp --      Temp Source 06/11/22 1103 Oral     SpO2 06/11/22 1103 100 %     Weight 06/11/22 1101 242 lb (109.8 kg)     Height 06/11/22 1101 6' (1.829 m)     Head Circumference --      Peak Flow --      Pain Score --      Pain Loc --      Pain Edu? --      Excl. in GC? --     Most recent vital signs: Vitals:   06/11/22 1103  BP: (!) 171/90  Pulse: 78  Resp: 16  SpO2: 100%     General: Awake, no distress.  CV:  Good peripheral perfusion.  Resp:  Normal effort.  Abd:  No distention. No tenderness. No rebound or guarding. No distension. Other:  MMM. Well appearing and well hydrated.   ED Results / Procedures / Treatments   Labs (all labs ordered are listed, but only abnormal results are displayed) Labs Reviewed - No data to display   EKG    RADIOLOGY    I also independently reviewed and agree with radiologist interpretations.   PROCEDURES:  Critical Care performed: No   MEDICATIONS ORDERED IN ED: Medications  alum & mag hydroxide-simeth (MAALOX/MYLANTA) 200-200-20 MG/5ML suspension 30 mL (30 mLs Oral  Given 06/11/22 1119)     IMPRESSION / MDM / ASSESSMENT AND PLAN / ED COURSE  I reviewed the triage vital signs and the nursing notes.                               This patient presents to the ED for concern of food impaction, this involves an extensive number of treatment options, and is a complaint that carries with it a high risk of complications and morbidity.  The differential diagnosis includes: food impaction, stricture, achalasia, GERD, gastritis, less likely ACS/cardiac.   Co morbidities that complicate the patient evaluation  HTN DM H/o esophageal strictures   Additional history obtained:  Additional history obtained from None External records from outside source obtained and reviewed including prior endocrine/DM notes, reviewed for GI notes   Lab Tests:  I Ordered, and personally interpreted labs.  The pertinent results include:   None  Problem List / ED Course / Critical interventions / Medication management  Esophageal impaction  Resolved completely after warm soda given in ED, pt felt bolus "move through" and is now tolerating PO, asymptomatic, in NAD. Denies any preceding difficulty swallowing or signs of worsening stricture. I had a long discussion with him that he should be on an antacid and have regular GI f/u for his stricture and screening EGDs with risk of CA, and pt is in agreement. He is o/w well appearing and sx resolved after soda. Do not suspect cardiac or alternative source. Abdomen is soft and ND. I ordered medication including soda  for impaction  Reevaluation of the patient after these medicines showed that the patient improved I have reviewed the patients home medicines and have made adjustments as needed   Social Determinants of Health:  Non smoker No heavy EtOH use   Test / Admission - Considered:  Sx resolved, tolerating PO - f/u as outpatient   FINAL CLINICAL IMPRESSION(S) / ED DIAGNOSES   Final diagnoses:  Food impaction of  esophagus, initial encounter     Rx / DC Orders   ED Discharge Orders          Ordered    pantoprazole (PROTONIX) 40 MG tablet  Daily        06/11/22 1127             Note:  This document was prepared using Dragon voice recognition software and may include unintentional dictation errors.   Shaune Pollack, MD 06/11/22 1133

## 2022-06-11 NOTE — Discharge Instructions (Signed)
As we discussed,  Start an antacid daily. I'd recommend the Protonix prescribed but you can substitute this with over-the-counter Omeprazole or Lansoprazole bought at any pharmacy (generic is fine).  Avoid fatty foods and large pieces of meat  Drink warm soda if symptoms return   As we discussed, food impaction can be a sign of an obstruction or mass in your esophagus, and you should ideally be having routine follow-up with a GI specialist for scopes - call the number above to set up an appointment

## 2022-12-04 ENCOUNTER — Emergency Department: Payer: Medicare Other

## 2022-12-04 ENCOUNTER — Other Ambulatory Visit: Payer: Self-pay

## 2022-12-04 ENCOUNTER — Emergency Department
Admission: EM | Admit: 2022-12-04 | Discharge: 2022-12-04 | Disposition: A | Discharge status: Home / Self Care | Payer: Medicare Other | Attending: Emergency Medicine | Admitting: Emergency Medicine

## 2022-12-04 DIAGNOSIS — R2231 Localized swelling, mass and lump, right upper limb: Secondary | ICD-10-CM | POA: Insufficient documentation

## 2022-12-04 DIAGNOSIS — I10 Essential (primary) hypertension: Secondary | ICD-10-CM | POA: Diagnosis not present

## 2022-12-04 DIAGNOSIS — E119 Type 2 diabetes mellitus without complications: Secondary | ICD-10-CM | POA: Insufficient documentation

## 2022-12-04 DIAGNOSIS — M25531 Pain in right wrist: Secondary | ICD-10-CM

## 2022-12-04 NOTE — Discharge Instructions (Addendum)
You may use the thumb spica splint during the day and remove it at night and when you shower.  Please continue to rest, ice, elevate your hand.  Please follow-up with orthopedics if your symptoms persist.  It was a pleasure caring for you today.

## 2022-12-04 NOTE — ED Triage Notes (Signed)
Pt comes with c/o right wrist pain. Pt state he injured it while mowing.

## 2022-12-04 NOTE — ED Provider Notes (Signed)
[facility] Provider Note    Event Date/Time   First MD Initiated Contact with Patient 12/04/22 1050     (approximate)   History   Wrist Pain   HPI  Joel Norman is a 57 y.o. male who presents today for evaluation of right wrist pain.  Patient reports that he was working in the yard when a large branch hit him in the wrist.  He reports that he had immediate pain and swelling to this area.  He is able to flex and extend his wrist normally.  He reports that he has pain primarily with moving his thumb.  No open wounds.  Patient Active Problem List   Diagnosis Date Noted   Elevated cholesterol    Hypertension    Diabetes mellitus without complication (HCC)    Anxiety    Arachnoid cyst    ED (erectile dysfunction)    Migraines           Physical Exam   Triage Vital Signs: ED Triage Vitals  Enc Vitals Group     BP 12/04/22 1016 (!) 155/88     Pulse Rate 12/04/22 1016 73     Resp 12/04/22 1016 17     Temp 12/04/22 1016 98.1 F (36.7 C)     Temp Source 12/04/22 1016 Oral     SpO2 12/04/22 1016 97 %     Weight 12/04/22 1017 248 lb (112.5 kg)     Height 12/04/22 1017 6' (1.829 m)     Head Circumference --      Peak Flow --      Pain Score 12/04/22 1021 10     Pain Loc --      Pain Edu? --      Excl. in GC? --     Most recent vital signs: Vitals:   12/04/22 1016  BP: (!) 155/88  Pulse: 73  Resp: 17  Temp: 98.1 F (36.7 C)  SpO2: 97%    Physical Exam Vitals and nursing note reviewed.  Constitutional:      General: Awake and alert. No acute distress.    Appearance: Normal appearance. The patient is normal weight.  HENT:     Head: Normocephalic and atraumatic.     Mouth: Mucous membranes are moist.  Eyes:     General: PERRL. Normal EOMs        Right eye: No discharge.        Left eye: No discharge.     Conjunctiva/sclera: Conjunctivae normal.  Cardiovascular:     Rate and Rhythm: Normal rate and regular rhythm.      Pulses: Normal pulses.     Heart sounds: Normal heart sounds Pulmonary:     Effort: Pulmonary effort is normal. No respiratory distress.     Breath sounds: Normal breath sounds.  Abdominal:     Abdomen is soft. There is no abdominal tenderness. No rebound or guarding. No distention. Musculoskeletal:        General: No swelling. Normal range of motion.     Cervical back: Normal range of motion and neck supple.  Right wrist: Tenderness palpation over the radial aspect of the wrist and with passive range of motion of the thumb.  Positive Finkelstein test.  Patient is able to touch thumb to each individual finger.  Normal radial pulse.  No open wounds.  No obvious swelling. Skin:    General: Skin is warm and dry.     Capillary Refill: Capillary refill  takes less than 2 seconds.     Findings: No rash.  Neurological:     Mental Status: The patient is awake and alert.      ED Results / Procedures / Treatments   Labs (all labs ordered are listed, but only abnormal results are displayed) Labs Reviewed - No data to display   EKG     RADIOLOGY I independently reviewed and interpreted imaging and agree with radiologists findings.     PROCEDURES:  Critical Care performed:   Procedures   MEDICATIONS ORDERED IN ED: Medications - No data to display   IMPRESSION / MDM / ASSESSMENT AND PLAN / ED COURSE  I reviewed the triage vital signs and the nursing notes.   Differential diagnosis includes, but is not limited to, contusion, fracture, dislocation, tendinitis.  Patient is awake and alert, hemodynamically stable and neurovascularly intact.  He has full and normal range of motion of his thumb, though he has pain with moving his thumb.  X-ray obtained in triage is negative for acute bony injury.  He particularly has pain with ulnar deviation of his wrist, and has a positive Finkelstein test.  I suspect some component of tendon inflammation such as dequervains tenosynovitis.  He was  given a thumb spica splint and instructed to remove it at night to decrease his risk for DVT.  We discussed rest, ice, elevation.  He has normal radial pulse, sensation intact throughout, normal strength.  He was also instructed to follow-up with orthopedics if his symptoms persist.  His wife requested the information for Dr. Ernest Pine given that he is her orthopedic surgeon.  We discussed return precautions and the importance of close outpatient follow-up.  Patient or stands and agrees with plan.  He was discharged in stable condition.   Patient's presentation is most consistent with acute complicated illness / injury requiring diagnostic workup.    FINAL CLINICAL IMPRESSION(S) / ED DIAGNOSES   Final diagnoses:  Right wrist pain     Rx / DC Orders   ED Discharge Orders     None        Note:  This document was prepared using Dragon voice recognition software and may include unintentional dictation errors.   Keturah Shavers 12/04/22 1212    Minna Antis, MD 12/04/22 1524

## 2023-09-01 ENCOUNTER — Emergency Department
Admission: EM | Admit: 2023-09-01 | Discharge: 2023-09-01 | Disposition: A | Discharge status: Home / Self Care | Payer: Medicare Other

## 2023-09-01 NOTE — ED Notes (Signed)
Pt informed first nurse that foreign object (food that was stuck in throat) has gone down, he was able to swallow it and is swallowing water. He does not want to stay to be seen. Pt has clear voice and unlabored respirations.

## 2023-11-21 ENCOUNTER — Emergency Department
Admission: EM | Admit: 2023-11-21 | Discharge: 2023-11-21 | Discharge status: Against Medical Advice | Attending: Emergency Medicine | Admitting: Emergency Medicine

## 2023-11-21 ENCOUNTER — Other Ambulatory Visit: Payer: Self-pay

## 2023-11-21 DIAGNOSIS — Z5321 Procedure and treatment not carried out due to patient leaving prior to being seen by health care provider: Secondary | ICD-10-CM | POA: Insufficient documentation

## 2023-11-21 DIAGNOSIS — R142 Eructation: Secondary | ICD-10-CM | POA: Diagnosis not present

## 2023-11-21 DIAGNOSIS — R09A2 Foreign body sensation, throat: Secondary | ICD-10-CM | POA: Diagnosis present

## 2023-11-21 NOTE — ED Triage Notes (Signed)
 Patient comes in from home via pov after eating a pork chop, and feeling like the food is stuck in his throat. Pt states that he usually doesn't eat pork chops, or chicken due to having issues with food getting stuck in his throat in the past. Pt states that he ate the pork chop about 45 min ago. Pt is belching on presentation, and very anxious. Pt is alert an oriented x4.

## 2023-11-21 NOTE — ED Notes (Signed)
 Pt to First Nurse Desk stating he no longer wanted to be seen. Pt states Vet, RN made patient laugh and pork chop that was suck in throat finally went down. Pt states he does not want to see doctor anymore. NAD noted. Respirations even and unlabored.

## 2024-03-22 ENCOUNTER — Emergency Department: Admission: EM | Admit: 2024-03-22 | Discharge: 2024-03-22 | Discharge status: Against Medical Advice

## 2024-03-22 NOTE — ED Triage Notes (Signed)
 Pt here with family was drinking a coke in the lobby waiting for triage, states he vomited and whatever was stuck in his throat came out and no longer wants to be seen.   Pt ambulatory without difficulty.
